# Patient Record
Sex: Female | Born: 1950
Health system: Southern US, Community
[De-identification: ages and names within clinical notes are randomized; demographics above are authoritative.]

## PROBLEM LIST (undated history)

## (undated) DIAGNOSIS — I10 Essential (primary) hypertension: Secondary | ICD-10-CM

## (undated) DIAGNOSIS — C4491 Basal cell carcinoma of skin, unspecified: Secondary | ICD-10-CM

## (undated) HISTORY — DX: Basal cell carcinoma of skin, unspecified: C44.91

## (undated) HISTORY — DX: Essential (primary) hypertension: I10

## (undated) HISTORY — PX: CARPAL TUNNEL RELEASE: SHX101

## (undated) HISTORY — PX: BASAL CELL CARCINOMA EXCISION: SHX1214

---

## 2002-07-06 ENCOUNTER — Encounter: Payer: Self-pay | Admitting: Family Medicine

## 2002-07-06 ENCOUNTER — Ambulatory Visit (HOSPITAL_COMMUNITY): Admission: RE | Admit: 2002-07-06 | Discharge: 2002-07-06 | Payer: Self-pay | Admitting: Family Medicine

## 2004-12-07 ENCOUNTER — Ambulatory Visit (HOSPITAL_COMMUNITY): Admission: RE | Admit: 2004-12-07 | Discharge: 2004-12-07 | Payer: Self-pay | Admitting: Family Medicine

## 2005-09-06 DIAGNOSIS — D229 Melanocytic nevi, unspecified: Secondary | ICD-10-CM

## 2005-09-06 HISTORY — DX: Melanocytic nevi, unspecified: D22.9

## 2008-12-08 DIAGNOSIS — C4491 Basal cell carcinoma of skin, unspecified: Secondary | ICD-10-CM

## 2008-12-08 HISTORY — DX: Basal cell carcinoma of skin, unspecified: C44.91

## 2010-11-19 ENCOUNTER — Encounter: Payer: Self-pay | Admitting: Family Medicine

## 2012-06-13 DIAGNOSIS — C4491 Basal cell carcinoma of skin, unspecified: Secondary | ICD-10-CM

## 2012-06-13 HISTORY — DX: Basal cell carcinoma of skin, unspecified: C44.91

## 2013-01-30 ENCOUNTER — Other Ambulatory Visit: Payer: Self-pay | Admitting: Nurse Practitioner

## 2013-03-05 ENCOUNTER — Encounter: Payer: Self-pay | Admitting: *Deleted

## 2013-03-06 ENCOUNTER — Ambulatory Visit (INDEPENDENT_AMBULATORY_CARE_PROVIDER_SITE_OTHER): Payer: 59 | Admitting: Nurse Practitioner

## 2013-03-06 ENCOUNTER — Encounter: Payer: Self-pay | Admitting: Nurse Practitioner

## 2013-03-06 VITALS — BP 132/84 | HR 70 | Ht 66.0 in | Wt 203.5 lb

## 2013-03-06 DIAGNOSIS — R5381 Other malaise: Secondary | ICD-10-CM

## 2013-03-06 DIAGNOSIS — I10 Essential (primary) hypertension: Secondary | ICD-10-CM | POA: Insufficient documentation

## 2013-03-06 DIAGNOSIS — R5383 Other fatigue: Secondary | ICD-10-CM

## 2013-03-06 MED ORDER — LOSARTAN POTASSIUM-HCTZ 100-25 MG PO TABS: 1.0000 | ORAL_TABLET | Freq: Every day | ORAL | Status: DC

## 2013-03-10 ENCOUNTER — Encounter: Payer: Self-pay | Admitting: Nurse Practitioner

## 2013-03-10 NOTE — Assessment & Plan Note (Signed)
Stop lisinopril due to cough. Switched to losartan/HCTZ. Recheck in 6 months.

## 2013-03-10 NOTE — Progress Notes (Signed)
Subjective:  Presents for routine followup. Has developed a half he cough over the past 2-3 months. Nonproductive. Occurs at any time. Keeps cough drops in her mouth to keep the cough under control. No chest pain. No wheezing. Minimal head congestion and runny nose. Nonsmoker. No shortness of breath. Currently on lisinopril/HCTZ for her blood pressure. Some generalized fatigue. No orthopnea. No hemoptysis.   Objective:   BP 132/84  Pulse 70  Ht 5\' 6"  (1.676 m)  Wt 203 lb 8 oz (92.307 kg)  BMI 32.86 kg/m2 NAD. Alert, oriented. Lungs clear. Heart regular rate rhythm. Lower extremities no edema.  Assessment: Hypertension - Plan: Basic metabolic panel, Lipid panel, Basic metabolic panel, Lipid panel  Fatigue - Plan: Basic metabolic panel, Hepatic function panel, TSH, Basic metabolic panel, Hepatic function panel, TSH   Plan: DC lisinopril. Most likely the cause of her persistent cough. Switched to losartan. Meds ordered this encounter  Medications  . losartan-hydrochlorothiazide (HYZAAR) 100-25 MG per tablet    Sig: Take 1 tablet by mouth daily.    Dispense:  30 tablet    Refill:  5    Order Specific Question:  Supervising Provider    Answer:  Merlyn Albert [[id number]]   Call back in 2-3 weeks if cough persist. Reminded about preventive health physical, this may be limited due to insurance coverage. Otherwise recheck in 6 months.

## 2013-07-10 DIAGNOSIS — C4492 Squamous cell carcinoma of skin, unspecified: Secondary | ICD-10-CM

## 2013-07-10 HISTORY — DX: Squamous cell carcinoma of skin, unspecified: C44.92

## 2013-08-28 ENCOUNTER — Other Ambulatory Visit: Payer: Self-pay | Admitting: Nurse Practitioner

## 2013-10-01 ENCOUNTER — Encounter: Payer: Self-pay | Admitting: Nurse Practitioner

## 2013-10-01 ENCOUNTER — Ambulatory Visit (INDEPENDENT_AMBULATORY_CARE_PROVIDER_SITE_OTHER): Payer: 59 | Admitting: Nurse Practitioner

## 2013-10-01 VITALS — BP 182/114 | Ht 66.0 in | Wt 209.0 lb

## 2013-10-01 DIAGNOSIS — I10 Essential (primary) hypertension: Secondary | ICD-10-CM

## 2013-10-01 DIAGNOSIS — R5381 Other malaise: Secondary | ICD-10-CM

## 2013-10-01 DIAGNOSIS — Z79899 Other long term (current) drug therapy: Secondary | ICD-10-CM

## 2013-10-01 MED ORDER — LOSARTAN POTASSIUM-HCTZ 100-25 MG PO TABS: ORAL_TABLET | ORAL | Status: DC

## 2013-10-05 ENCOUNTER — Encounter: Payer: Self-pay | Admitting: Nurse Practitioner

## 2013-10-05 NOTE — Assessment & Plan Note (Signed)
.   losartan-hydrochlorothiazide (HYZAAR) 100-25 MG per tablet    Sig: TAKE ONE TABLET BY MOUTH ONCE DAILY.    Dispense:  90 tablet    Refill:  1    Order Specific Question:  Supervising Provider    Answer:  Riccardo Dubin    The patient did not get lab work as recommended in May. Given new orders. Reviewed flu vaccine. Defers preventive health maintenance. Discussed importance of compliance with medication. Recheck in 6 months, call back sooner if any problems.

## 2013-10-05 NOTE — Progress Notes (Signed)
Subjective:  Presents for routine followup on her blood pressure. Has been out of her blood pressure medication for the past 2 days. No chest pain shortness of breath or edema. Does fairly well with her diet. Continues to refuse preventive health maintenance such as physical and immunizations.  Objective:   BP 182/114  Ht 5\' 6"  (1.676 m)  Wt 209 lb (94.802 kg)  BMI 33.75 kg/m2 NAD. Alert, oriented. Lungs clear. Heart regular rate rhythm. No murmur or gallop noted. Lower extremities no edema.  Assessment:Unspecified essential hypertension - Plan: Lipid panel  Encounter for long-term (current) use of other medications - Plan: Hepatic function panel, Basic metabolic panel  Other malaise and fatigue - Plan: TSH  Plan:  Meds ordered this encounter  Medications  . losartan-hydrochlorothiazide (HYZAAR) 100-25 MG per tablet    Sig: TAKE ONE TABLET BY MOUTH ONCE DAILY.    Dispense:  90 tablet    Refill:  1    Order Specific Question:  Supervising Provider    Answer:  Riccardo Dubin    The patient did not get lab work as recommended in May. Given new orders. Reviewed flu vaccine. Defers preventive health maintenance. Discussed importance of compliance with medication. Recheck in 6 months, call back sooner if any problems.

## 2014-03-18 ENCOUNTER — Other Ambulatory Visit: Payer: Self-pay | Admitting: Nurse Practitioner

## 2014-06-02 DIAGNOSIS — L570 Actinic keratosis: Secondary | ICD-10-CM | POA: Diagnosis not present

## 2014-06-02 DIAGNOSIS — L739 Follicular disorder, unspecified: Secondary | ICD-10-CM | POA: Diagnosis not present

## 2014-06-02 DIAGNOSIS — L821 Other seborrheic keratosis: Secondary | ICD-10-CM | POA: Diagnosis not present

## 2014-06-02 DIAGNOSIS — D485 Neoplasm of uncertain behavior of skin: Secondary | ICD-10-CM | POA: Diagnosis not present

## 2014-06-02 DIAGNOSIS — D2330 Other benign neoplasm of skin of unspecified part of face: Secondary | ICD-10-CM | POA: Diagnosis not present

## 2014-06-07 ENCOUNTER — Encounter: Payer: Self-pay | Admitting: Family Medicine

## 2014-06-07 ENCOUNTER — Ambulatory Visit (INDEPENDENT_AMBULATORY_CARE_PROVIDER_SITE_OTHER): Payer: Medicare Other | Admitting: Family Medicine

## 2014-06-07 VITALS — BP 136/86 | Ht 66.0 in | Wt 208.2 lb

## 2014-06-07 DIAGNOSIS — R5381 Other malaise: Secondary | ICD-10-CM | POA: Diagnosis not present

## 2014-06-07 DIAGNOSIS — R739 Hyperglycemia, unspecified: Secondary | ICD-10-CM | POA: Insufficient documentation

## 2014-06-07 DIAGNOSIS — R5383 Other fatigue: Secondary | ICD-10-CM

## 2014-06-07 DIAGNOSIS — I10 Essential (primary) hypertension: Secondary | ICD-10-CM | POA: Diagnosis not present

## 2014-06-07 DIAGNOSIS — E785 Hyperlipidemia, unspecified: Secondary | ICD-10-CM | POA: Insufficient documentation

## 2014-06-07 LAB — CBC WITH DIFFERENTIAL/PLATELET
Basophils Absolute: 0 10*3/uL (ref 0.0–0.1)
Basophils Relative: 1 % (ref 0–1)
EOS ABS: 0.1 10*3/uL (ref 0.0–0.7)
EOS PCT: 3 % (ref 0–5)
HEMATOCRIT: 40.1 % (ref 36.0–46.0)
HEMOGLOBIN: 13.7 g/dL (ref 12.0–15.0)
LYMPHS ABS: 1.4 10*3/uL (ref 0.7–4.0)
Lymphocytes Relative: 36 % (ref 12–46)
MCH: 30.2 pg (ref 26.0–34.0)
MCHC: 34.2 g/dL (ref 30.0–36.0)
MCV: 88.5 fL (ref 78.0–100.0)
MONO ABS: 0.3 10*3/uL (ref 0.1–1.0)
Monocytes Relative: 8 % (ref 3–12)
Neutro Abs: 2 10*3/uL (ref 1.7–7.7)
Neutrophils Relative %: 52 % (ref 43–77)
Platelets: 179 10*3/uL (ref 150–400)
RBC: 4.53 MIL/uL (ref 3.87–5.11)
RDW: 13 % (ref 11.5–15.5)
WBC: 3.9 10*3/uL — ABNORMAL LOW (ref 4.0–10.5)

## 2014-06-07 LAB — BASIC METABOLIC PANEL
BUN: 16 mg/dL (ref 6–23)
CALCIUM: 9.6 mg/dL (ref 8.4–10.5)
CO2: 30 mEq/L (ref 19–32)
Chloride: 102 mEq/L (ref 96–112)
Creat: 1.01 mg/dL (ref 0.50–1.10)
Glucose, Bld: 103 mg/dL — ABNORMAL HIGH (ref 70–99)
POTASSIUM: 3.8 meq/L (ref 3.5–5.3)
Sodium: 139 mEq/L (ref 135–145)

## 2014-06-07 LAB — LIPID PANEL
CHOL/HDL RATIO: 4.6 ratio
Cholesterol: 196 mg/dL (ref 0–200)
HDL: 43 mg/dL (ref >39)
LDL Cholesterol: 120 mg/dL — ABNORMAL HIGH (ref 0–99)
Triglycerides: 165 mg/dL — ABNORMAL HIGH (ref <150)
VLDL: 33 mg/dL (ref 0–40)

## 2014-06-07 MED ORDER — LOSARTAN POTASSIUM-HCTZ 100-25 MG PO TABS: ORAL_TABLET | ORAL | Status: DC

## 2014-06-07 NOTE — Patient Instructions (Signed)
DASH Eating Plan °DASH stands for "Dietary Approaches to Stop Hypertension." The DASH eating plan is a healthy eating plan that has been shown to reduce high blood pressure (hypertension). Additional health benefits may include reducing the risk of type 2 diabetes mellitus, heart disease, and stroke. The DASH eating plan may also help with weight loss. °WHAT DO I NEED TO KNOW ABOUT THE DASH EATING PLAN? °For the DASH eating plan, you will follow these general guidelines: °· Choose foods with a percent daily value for sodium of less than 5% (as listed on the food label). °· Use salt-free seasonings or herbs instead of table salt or sea salt. °· Check with your health care provider or pharmacist before using salt substitutes. °· Eat lower-sodium products, often labeled as "lower sodium" or "no salt added." °· Eat fresh foods. °· Eat more vegetables, fruits, and low-fat dairy products. °· Choose whole grains. Look for the word "whole" as the first word in the ingredient list. °· Choose fish and skinless chicken or turkey more often than red meat. Limit fish, poultry, and meat to 6 oz (170 g) each day. °· Limit sweets, desserts, sugars, and sugary drinks. °· Choose heart-healthy fats. °· Limit cheese to 1 oz (28 g) per day. °· Eat more home-cooked food and less restaurant, buffet, and fast food. °· Limit fried foods. °· Cook foods using methods other than frying. °· Limit canned vegetables. If you do use them, rinse them well to decrease the sodium. °· When eating at a restaurant, ask that your food be prepared with less salt, or no salt if possible. °WHAT FOODS CAN I EAT? °Seek help from a dietitian for individual calorie needs. °Grains °Whole grain or whole wheat bread. Brown rice. Whole grain or whole wheat pasta. Quinoa, bulgur, and whole grain cereals. Low-sodium cereals. Corn or whole wheat flour tortillas. Whole grain cornbread. Whole grain crackers. Low-sodium crackers. °Vegetables °Fresh or frozen vegetables  (raw, steamed, roasted, or grilled). Low-sodium or reduced-sodium tomato and vegetable juices. Low-sodium or reduced-sodium tomato sauce and paste. Low-sodium or reduced-sodium canned vegetables.  °Fruits °All fresh, canned (in natural juice), or frozen fruits. °Meat and Other Protein Products °Ground beef (85% or leaner), grass-fed beef, or beef trimmed of fat. Skinless chicken or turkey. Ground chicken or turkey. Pork trimmed of fat. All fish and seafood. Eggs. Dried beans, peas, or lentils. Unsalted nuts and seeds. Unsalted canned beans. °Dairy °Low-fat dairy products, such as skim or 1% milk, 2% or reduced-fat cheeses, low-fat ricotta or cottage cheese, or plain low-fat yogurt. Low-sodium or reduced-sodium cheeses. °Fats and Oils °Tub margarines without trans fats. Light or reduced-fat mayonnaise and salad dressings (reduced sodium). Avocado. Safflower, olive, or canola oils. Natural peanut or almond butter. °Other °Unsalted popcorn and pretzels. °The items listed above may not be a complete list of recommended foods or beverages. Contact your dietitian for more options. °WHAT FOODS ARE NOT RECOMMENDED? °Grains °White bread. White pasta. White rice. Refined cornbread. Bagels and croissants. Crackers that contain trans fat. °Vegetables °Creamed or fried vegetables. Vegetables in a cheese sauce. Regular canned vegetables. Regular canned tomato sauce and paste. Regular tomato and vegetable juices. °Fruits °Dried fruits. Canned fruit in light or heavy syrup. Fruit juice. °Meat and Other Protein Products °Fatty cuts of meat. Ribs, chicken wings, bacon, sausage, bologna, salami, chitterlings, fatback, hot dogs, bratwurst, and packaged luncheon meats. Salted nuts and seeds. Canned beans with salt. °Dairy °Whole or 2% milk, cream, half-and-half, and cream cheese. Whole-fat or sweetened yogurt. Full-fat   cheeses or blue cheese. Nondairy creamers and whipped toppings. Processed cheese, cheese spreads, or cheese  curds. °Condiments °Onion and garlic salt, seasoned salt, table salt, and sea salt. Canned and packaged gravies. Worcestershire sauce. Tartar sauce. Barbecue sauce. Teriyaki sauce. Soy sauce, including reduced sodium. Steak sauce. Fish sauce. Oyster sauce. Cocktail sauce. Horseradish. Ketchup and mustard. Meat flavorings and tenderizers. Bouillon cubes. Hot sauce. Tabasco sauce. Marinades. Taco seasonings. Relishes. °Fats and Oils °Butter, stick margarine, lard, shortening, ghee, and bacon fat. Coconut, palm kernel, or palm oils. Regular salad dressings. °Other °Pickles and olives. Salted popcorn and pretzels. °The items listed above may not be a complete list of foods and beverages to avoid. Contact your dietitian for more information. °WHERE CAN I FIND MORE INFORMATION? °National Heart, Lung, and [employer] Institute: www.nhlbi.nih.gov/health/health-topics/topics/dash/ °Document Released: 10/04/2011 Document Revised: 03/01/2014 Document Reviewed: 08/19/2013 °ExitCare® Patient Information ©2015 [employer]. This information is not intended to replace advice given to you by your health care provider. Make sure you discuss any questions you have with your health care provider. ° °

## 2014-06-07 NOTE — Progress Notes (Signed)
   Subjective:    Patient ID: Charlene Thompson, female    DOB: June 17, 1951, 63 y.o.   MRN: [id number]  Hypertension This is a chronic problem. The current episode started more than 1 year ago. Pertinent negatives include no chest pain or shortness of breath. Risk factors for coronary artery disease include post-menopausal state. Treatments tried: hyzaar. There are no compliance problems.    I had a long discussion with the patient regarding the importance of healthy choices minimizing salt regular physical activity.   Review of Systems  Constitutional: Negative for activity change, appetite change and fatigue.  Respiratory: Negative for shortness of breath.   Cardiovascular: Negative for chest pain.  Gastrointestinal: Negative for abdominal pain.  Endocrine: Negative for polydipsia and polyphagia.  Genitourinary: Negative for frequency.  Neurological: Negative for weakness.  Psychiatric/Behavioral: Negative for confusion.       Objective:   Physical Exam  Vitals reviewed. Constitutional: She appears well-nourished. No distress.  Cardiovascular: Normal rate, regular rhythm and normal heart sounds.   No murmur heard. Pulmonary/Chest: Effort normal and breath sounds normal. No respiratory distress.  Musculoskeletal: She exhibits no edema.  Lymphadenopathy:    She has no cervical adenopathy.  Neurological: She is alert. She exhibits normal muscle tone.  Psychiatric: Her behavior is normal.          Assessment & Plan:  #1 HTN she is to try to work on salt intake she is to try to exercise a little bit more. Continue with her medication. Followup 6 months.  #2 I did recommend some screening lab work. Await the results of these  #3 I did recommend a wellness exam I recommend mammogram recommended colonoscopy not sure patient will follow through to do these.

## 2014-07-01 ENCOUNTER — Telehealth: Payer: Self-pay

## 2014-07-01 NOTE — Telephone Encounter (Signed)
Dr Wolfgang Phoenix suggested patient call to set up her colonoscopy. Please call her back at [phone]

## 2014-07-02 ENCOUNTER — Encounter: Payer: Self-pay | Admitting: Gastroenterology

## 2014-07-06 DIAGNOSIS — Z1231 Encounter for screening mammogram for malignant neoplasm of breast: Secondary | ICD-10-CM | POA: Diagnosis not present

## 2014-07-06 NOTE — Telephone Encounter (Signed)
PATIENT RETURNED CALL 07/02/14  PLEASE CALL BACK [phone]

## 2014-07-06 NOTE — Telephone Encounter (Signed)
LMOM for pt to call back after 3:30 this afternoon to be triaged for colonoscopy.

## 2014-07-06 NOTE — Telephone Encounter (Signed)
Pt said she has called another GI doctor for consultation and she will see how that goes and let us know if she needs [place].

## 2014-07-12 NOTE — Telephone Encounter (Signed)
Noted  

## 2014-08-17 ENCOUNTER — Encounter: Payer: Medicare Other | Admitting: Nurse Practitioner

## 2014-09-03 ENCOUNTER — Encounter: Payer: Self-pay | Admitting: Gastroenterology

## 2014-09-03 ENCOUNTER — Ambulatory Visit (INDEPENDENT_AMBULATORY_CARE_PROVIDER_SITE_OTHER): Payer: Medicare Other | Admitting: Gastroenterology

## 2014-09-03 VITALS — BP 126/90 | HR 68 | Ht 66.0 in | Wt 207.5 lb

## 2014-09-03 DIAGNOSIS — I10 Essential (primary) hypertension: Secondary | ICD-10-CM

## 2014-09-03 DIAGNOSIS — Z800 Family history of malignant neoplasm of digestive organs: Secondary | ICD-10-CM

## 2014-09-03 MED ORDER — NA SULFATE-K SULFATE-MG SULF 17.5-3.13-1.6 GM/177ML PO SOLN: 1.0000 | Freq: Once | ORAL | Status: DC

## 2014-09-03 NOTE — Progress Notes (Signed)
    _                                                                                                                History of Present Illness:  Ms. Wisham pleasant 63 year old white female referred for screening colonoscopy.  The only history is pertinent for sister who apparently had colon cancer.  Mrs. Hamidi has no GI complaints including change of bowel habits, abdominal pain, melena or hematochezia   Past Medical History  Diagnosis Date  . Hypertension   . Basal cell carcinoma    Past Surgical History  Procedure Laterality Date  . Basal cell carcinoma excision      nose  . Carpal tunnel release Right    family history includes Diabetes in her brother, mother, and sister; Heart disease in her brother and mother; Kidney disease in her sister. Current Outpatient Prescriptions  Medication Sig Dispense Refill  . losartan-hydrochlorothiazide (HYZAAR) 100-25 MG per tablet TAKE ONE TABLET BY MOUTH ONCE DAILY. 90 tablet 2   No current facility-administered medications for this visit.   Allergies as of 09/03/2014 - Review Complete 09/03/2014  Allergen Reaction Noted  . Valium [diazepam]  03/05/2013    reports that she has never smoked. She has never used smokeless tobacco. She reports that she drinks alcohol. She reports that she does not use illicit drugs.   Review of Systems: Pertinent positive and negative review of systems were noted in the above HPI section. All other review of systems were otherwise negative.  Vital signs were reviewed in today's medical record Physical Exam: General: Well developed , well nourished, no acute distress Skin: anicteric Head: Normocephalic and atraumatic Eyes:  sclerae anicteric, EOMI Ears: Normal auditory acuity Mouth: No deformity or lesions Neck: Supple, no masses or thyromegaly Lungs: Clear throughout to auscultation Heart: Regular rate and rhythm; no murmurs, rubs or bruits Abdomen: Soft, non tender and non distended.  No masses, hepatosplenomegaly or hernias noted. Normal Bowel sounds Rectal:deferred Musculoskeletal: Symmetrical with no gross deformities  Skin: No lesions on visible extremities Pulses:  Normal pulses noted Extremities: No clubbing, cyanosis, edema or deformities noted Neurological: Alert oriented x 4, grossly nonfocal Cervical Nodes:  No significant cervical adenopathy Inguinal Nodes: No significant inguinal adenopathy Psychological:  Alert and cooperative. Normal mood and affect  See Assessment and Plan under Problem List

## 2014-09-03 NOTE — Patient Instructions (Signed)

## 2014-09-03 NOTE — Assessment & Plan Note (Signed)
Plan screening COLONOSCOPY every 5 years

## 2014-11-22 ENCOUNTER — Encounter: Payer: Self-pay | Admitting: Gastroenterology

## 2014-11-22 ENCOUNTER — Ambulatory Visit (AMBULATORY_SURGERY_CENTER): Payer: Medicare Other | Admitting: Gastroenterology

## 2014-11-22 VITALS — BP 119/69 | HR 51 | Temp 97.5°F | Resp 28 | Ht 66.0 in | Wt 207.0 lb

## 2014-11-22 DIAGNOSIS — I10 Essential (primary) hypertension: Secondary | ICD-10-CM | POA: Diagnosis not present

## 2014-11-22 DIAGNOSIS — Z800 Family history of malignant neoplasm of digestive organs: Secondary | ICD-10-CM

## 2014-11-22 DIAGNOSIS — D122 Benign neoplasm of ascending colon: Secondary | ICD-10-CM | POA: Diagnosis not present

## 2014-11-22 DIAGNOSIS — Z1211 Encounter for screening for malignant neoplasm of colon: Secondary | ICD-10-CM

## 2014-11-22 MED ORDER — SODIUM CHLORIDE 0.9 % IV SOLN: 500.0000 mL | INTRAVENOUS | Status: DC

## 2014-11-22 NOTE — Op Note (Signed)
[place]  [address] [place], [id number]   COLONOSCOPY PROCEDURE REPORT  PATIENT: Charlene Thompson, Charlene Thompson  MR#: [id number] BIRTHDATE: 1951-01-27 , 63  yrs. old GENDER: female ENDOSCOPIST: Inda Castle, MD REFERRED LM:BEMLJ Wolfgang Phoenix, M.D. PROCEDURE DATE:  11/22/2014 PROCEDURE:   Colonoscopy with snare polypectomy First Screening Colonoscopy - Avg.  risk and is 50 yrs.  old or older Yes.  Prior Negative Screening - Now for repeat screening. N/A  History of Adenoma - Now for follow-up colonoscopy & has been > or = to 3 yrs.  N/A  Polyps Removed Today? Yes. ASA CLASS:   Class II INDICATIONS:patient's immediate family history of colon cancer. MEDICATIONS: Monitored anesthesia care and Propofol 170 mg IV  DESCRIPTION OF PROCEDURE:   After the risks benefits and alternatives of the procedure were thoroughly explained, informed consent was obtained.  The digital rectal exam revealed no abnormalities of the rectum.   The LB [id number]  endoscope was introduced through the anus and advanced to the cecum, which was identified by both the appendix and ileocecal valve. No adverse events experienced.   The quality of the prep was excellent using Suprep  The instrument was then slowly withdrawn as the colon was fully examined.      COLON FINDINGS: A sessile polyp measuring 4 mm in size was found in the ascending colon.  A polypectomy was performed with a cold snare.  The resection was complete, the polyp tissue was completely retrieved and sent to histology.   There was mild diverticulosis noted in the descending colon and sigmoid colon.   Internal hemorrhoids were found.  Retroflexed views revealed no abnormalities. The time to cecum=2 minutes 40 seconds.  Withdrawal time=7 minutes 02 seconds.  The scope was withdrawn and the procedure completed. COMPLICATIONS: There were no immediate complications.  ENDOSCOPIC IMPRESSION: 1.   Sessile polyp was found in the  ascending colon; polypectomy was performed with a cold snare 2.   There was mild diverticulosis noted in the descending colon and sigmoid colon 3.   Internal hemorrhoids  RECOMMENDATIONS: Given your significant family history of colon cancer, you should have a repeat colonoscopy in 5 years  eSigned:  Inda Castle, MD 11/22/2014 8:36 AM   cc:   PATIENT NAME:  Charlene Thompson, Charlene Thompson MR#: [id number]

## 2014-11-22 NOTE — Patient Instructions (Addendum)
Handouts given on polyps, diverticulosis, high fiber diet, and hemorrhoids. Repeat colonoscopy in 5 years.   YOU HAD AN ENDOSCOPIC PROCEDURE TODAY AT [facility]: Refer to the procedure report that was given to you for any specific questions about what was found during the examination.  If the procedure report does not answer your questions, please call your gastroenterologist to clarify.  If you requested that your care partner not be given the details of your procedure findings, then the procedure report has been included in a sealed envelope for you to review at your convenience later.  YOU SHOULD EXPECT: Some feelings of bloating in the abdomen. Passage of more gas than usual.  Walking can help get rid of the air that was put into your GI tract during the procedure and reduce the bloating. If you had a lower endoscopy (such as a colonoscopy or flexible sigmoidoscopy) you may notice spotting of blood in your stool or on the toilet paper. If you underwent a bowel prep for your procedure, then you may not have a normal bowel movement for a few days.  DIET: Your first meal following the procedure should be a light meal and then it is ok to progress to your normal diet.  A half-sandwich or bowl of soup is an example of a good first meal.  Heavy or fried foods are harder to digest and may make you feel nauseous or bloated.  Likewise meals heavy in dairy and vegetables can cause extra gas to form and this can also increase the bloating.  Drink plenty of fluids but you should avoid alcoholic beverages for 24 hours.  ACTIVITY: Your care partner should take you home directly after the procedure.  You should plan to take it easy, moving slowly for the rest of the day.  You can resume normal activity the day after the procedure however you should NOT DRIVE or use heavy machinery for 24 hours (because of the sedation medicines used during the test).    SYMPTOMS TO REPORT IMMEDIATELY: A  gastroenterologist can be reached at any hour.  During normal business hours, 8:30 AM to 5:00 PM Monday through Friday, call [phone].  After hours and on weekends, please call the GI answering service at [phone] who will take a message and have the physician on call contact you.   Following lower endoscopy (colonoscopy or flexible sigmoidoscopy):  Excessive amounts of blood in the stool  Significant tenderness or worsening of abdominal pains  Swelling of the abdomen that is new, acute  Fever of 100F or higher  FOLLOW UP: If any biopsies were taken you will be contacted by phone or by letter within the next 1-3 weeks.  Call your gastroenterologist if you have not heard about the biopsies in 3 weeks.  Our staff will call the home number listed on your records the next business day following your procedure to check on you and address any questions or concerns that you may have at that time regarding the information given to you following your procedure. This is a courtesy call and so if there is no answer at the home number and we have not heard from you through the emergency physician on call, we will assume that you have returned to your regular daily activities without incident.  SIGNATURES/CONFIDENTIALITY: You and/or your care partner have signed paperwork which will be entered into your electronic medical record.  These signatures attest to the fact that that the information above on your  After Visit Summary has been reviewed and is understood.  Full responsibility of the confidentiality of this discharge information lies with you and/or your care-partner.

## 2014-11-22 NOTE — Progress Notes (Signed)
Procedure ends, to recovery, report given and VSS. 

## 2014-11-22 NOTE — Progress Notes (Signed)
Called to room to assist during endoscopic procedure.  Patient ID and intended procedure confirmed with present staff. Received instructions for my participation in the procedure from the performing physician.  

## 2014-11-23 ENCOUNTER — Telehealth: Payer: Self-pay

## 2014-11-23 NOTE — Telephone Encounter (Signed)
  Follow up Call-  Call back number 11/22/2014  Post procedure Call Back phone  # [phone]  Permission to leave phone message Yes     Patient questions:  Do you have a fever, pain , or abdominal swelling? No. Pain Score  0 *  Have you tolerated food without any problems? Yes.    Have you been able to return to your normal activities? Yes.    Do you have any questions about your discharge instructions: Diet   No. Medications  No. Follow up visit  No.  Do you have questions or concerns about your Care? No.  Actions: * If pain score is 4 or above: No action needed, pain <4.

## 2014-12-01 ENCOUNTER — Encounter: Payer: Self-pay | Admitting: Gastroenterology

## 2015-02-22 ENCOUNTER — Other Ambulatory Visit: Payer: Self-pay | Admitting: Physician Assistant

## 2015-02-22 DIAGNOSIS — L719 Rosacea, unspecified: Secondary | ICD-10-CM | POA: Diagnosis not present

## 2015-02-22 DIAGNOSIS — L821 Other seborrheic keratosis: Secondary | ICD-10-CM | POA: Diagnosis not present

## 2015-02-22 DIAGNOSIS — D485 Neoplasm of uncertain behavior of skin: Secondary | ICD-10-CM | POA: Diagnosis not present

## 2015-03-04 ENCOUNTER — Ambulatory Visit (INDEPENDENT_AMBULATORY_CARE_PROVIDER_SITE_OTHER): Payer: Medicare Other | Admitting: Nurse Practitioner

## 2015-03-04 ENCOUNTER — Encounter: Payer: Self-pay | Admitting: Nurse Practitioner

## 2015-03-04 VITALS — BP 132/84 | Ht 66.25 in | Wt 207.2 lb

## 2015-03-04 DIAGNOSIS — Z0000 Encounter for general adult medical examination without abnormal findings: Secondary | ICD-10-CM

## 2015-03-04 DIAGNOSIS — Z13820 Encounter for screening for osteoporosis: Secondary | ICD-10-CM

## 2015-03-04 DIAGNOSIS — R739 Hyperglycemia, unspecified: Secondary | ICD-10-CM

## 2015-03-04 DIAGNOSIS — R7303 Prediabetes: Secondary | ICD-10-CM

## 2015-03-04 DIAGNOSIS — Z780 Asymptomatic menopausal state: Secondary | ICD-10-CM

## 2015-03-04 DIAGNOSIS — R7309 Other abnormal glucose: Secondary | ICD-10-CM

## 2015-03-04 DIAGNOSIS — Z23 Encounter for immunization: Secondary | ICD-10-CM

## 2015-03-04 DIAGNOSIS — Z01419 Encounter for gynecological examination (general) (routine) without abnormal findings: Secondary | ICD-10-CM

## 2015-03-04 LAB — POCT GLYCOSYLATED HEMOGLOBIN (HGB A1C): Hemoglobin A1C: 5.8

## 2015-03-04 MED ORDER — LOSARTAN POTASSIUM-HCTZ 100-25 MG PO TABS: ORAL_TABLET | ORAL | Status: DC

## 2015-03-06 ENCOUNTER — Encounter: Payer: Self-pay | Admitting: Nurse Practitioner

## 2015-03-06 DIAGNOSIS — R7303 Prediabetes: Secondary | ICD-10-CM | POA: Insufficient documentation

## 2015-03-06 NOTE — Progress Notes (Signed)
   Subjective:    Patient ID: Charlene Thompson, female    DOB: 1951/06/02, 64 y.o.   MRN: [id number]  HPI presents for her wellness exam. Married, same sexual partner. No vaginal bleeding. No pelvic pain. Needs vision and dental exams. Had her colonoscopy in January. History of normal PAP smears. Gets skin cancer screenings. Very active lifestyle. Overall healthy diet.     Review of Systems  Constitutional: Negative for activity change, appetite change and fatigue.  HENT: Negative for dental problem, ear pain, sinus pressure and sore throat.   Respiratory: Negative for cough, chest tightness, shortness of breath and wheezing.   Cardiovascular: Negative for chest pain.  Gastrointestinal: Negative for nausea, vomiting, abdominal pain, diarrhea, constipation and abdominal distention.  Genitourinary: Negative for dysuria, urgency, frequency, vaginal discharge, enuresis, difficulty urinating, genital sores and pelvic pain.       Objective:   Physical Exam  Constitutional: She is oriented to person, place, and time. She appears well-developed. No distress.  HENT:  Right Ear: External ear normal.  Left Ear: External ear normal.  Mouth/Throat: Oropharynx is clear and moist.  Neck: Normal range of motion. Neck supple. No tracheal deviation present. No thyromegaly present.  Cardiovascular: Normal rate, regular rhythm and normal heart sounds.  Exam reveals no gallop.   No murmur heard. Pulmonary/Chest: Effort normal and breath sounds normal.  Abdominal: Soft. She exhibits no distension. There is no tenderness.  Genitourinary: Vagina normal and uterus normal. No vaginal discharge found.  External GU: no rashes or lesions. Vagina: no discharge. Cervix nl in appearance.  Bimanual exam: no CMT; no tenderness or obvious masses.    Musculoskeletal: She exhibits no edema.  Lymphadenopathy:    She has no cervical adenopathy.  Neurological: She is alert and oriented to person, place, and time.  Skin:  Skin is warm and dry. No rash noted.  Psychiatric: She has a normal mood and affect. Her behavior is normal.  Vitals reviewed. Breast exam: no masses; axillae no adenopathy. Results for orders placed or performed in visit on 03/04/15  POCT glycosylated hemoglobin (Hb A1C)  Result Value Ref Range   Hemoglobin A1C 5.8           Assessment & Plan:  Well woman exam  Hyperglycemia - Plan: POCT glycosylated hemoglobin (Hb A1C)  Need for vaccination - Plan: Pneumococcal polysaccharide vaccine 23-valent greater than or equal to 2yo subcutaneous/IM  Screening for osteoporosis  Post-menopausal - Plan: DG Bone Density  Recommend regular exercise, healthy diet and weight loss. Daily vitamin D/calcium. Return in about 6 months (around 09/04/2015) for recheck.

## 2015-03-09 ENCOUNTER — Ambulatory Visit (HOSPITAL_COMMUNITY)
Admission: RE | Admit: 2015-03-09 | Discharge: 2015-03-09 | Disposition: A | Discharge status: Home / Self Care | Payer: Medicare Other | Source: Ambulatory Visit | Attending: Nurse Practitioner | Admitting: Nurse Practitioner

## 2015-03-09 DIAGNOSIS — M8580 Other specified disorders of bone density and structure, unspecified site: Secondary | ICD-10-CM | POA: Insufficient documentation

## 2015-03-09 DIAGNOSIS — M85852 Other specified disorders of bone density and structure, left thigh: Secondary | ICD-10-CM | POA: Diagnosis not present

## 2015-03-09 DIAGNOSIS — Z780 Asymptomatic menopausal state: Secondary | ICD-10-CM | POA: Insufficient documentation

## 2015-03-10 ENCOUNTER — Encounter: Payer: Self-pay | Admitting: Nurse Practitioner

## 2015-03-10 DIAGNOSIS — M8580 Other specified disorders of bone density and structure, unspecified site: Secondary | ICD-10-CM | POA: Insufficient documentation

## 2015-06-07 ENCOUNTER — Other Ambulatory Visit: Payer: Self-pay | Admitting: Nurse Practitioner

## 2015-08-29 ENCOUNTER — Ambulatory Visit (INDEPENDENT_AMBULATORY_CARE_PROVIDER_SITE_OTHER): Payer: Medicare Other | Admitting: Nurse Practitioner

## 2015-08-29 ENCOUNTER — Encounter: Payer: Self-pay | Admitting: Nurse Practitioner

## 2015-08-29 VITALS — BP 136/82 | Ht 66.0 in | Wt 206.2 lb

## 2015-08-29 DIAGNOSIS — R7303 Prediabetes: Secondary | ICD-10-CM | POA: Diagnosis not present

## 2015-08-29 DIAGNOSIS — I10 Essential (primary) hypertension: Secondary | ICD-10-CM

## 2015-08-29 DIAGNOSIS — E785 Hyperlipidemia, unspecified: Secondary | ICD-10-CM | POA: Diagnosis not present

## 2015-08-29 MED ORDER — LOSARTAN POTASSIUM-HCTZ 100-25 MG PO TABS
1.0000 | ORAL_TABLET | Freq: Every day | ORAL | Status: DC
Start: 2015-08-29 — End: 2016-05-29

## 2015-08-29 NOTE — Progress Notes (Signed)
Subjective:  Presents for routine follow-up. No chest pain/ischemic type pain shortness of breath or edema. Active lifestyle. No changes in her diet.  Objective:   BP 136/82 mmHg  Ht 5\' 6"  (1.676 m)  Wt 206 lb 4 oz (93.554 kg)  BMI 33.31 kg/m2 NAD. Alert, oriented. Lungs clear. Heart regular rate rhythm. Lower extremities no edema.   Assessment:  Problem List Items Addressed This Visit      Cardiovascular and Mediastinum   Hypertension - Primary   Relevant Medications   losartan-hydrochlorothiazide (HYZAAR) 100-25 MG tablet   Other Relevant Orders   Basic metabolic panel     Other   Hyperlipidemia   Relevant Medications   losartan-hydrochlorothiazide (HYZAAR) 100-25 MG tablet   Other Relevant Orders   Lipid panel   Hepatic function panel   Prediabetes     Plan:  Meds ordered this encounter  Medications  . losartan-hydrochlorothiazide (HYZAAR) 100-25 MG tablet    Sig: Take 1 tablet by mouth daily.    Dispense:  90 tablet    Refill:  1    Order Specific Question:  Supervising Provider    Answer:  Maggie Font   Lab work pending. Encouraged healthy diet weight loss and regular activity. Also daily vitamin D and calcium. Given prescription for Zostavax. Also recommend tetanus update. Defers flu vaccine. Return in about 6 months (around 02/26/2016) for recheck.

## 2016-02-21 DIAGNOSIS — D485 Neoplasm of uncertain behavior of skin: Secondary | ICD-10-CM | POA: Diagnosis not present

## 2016-02-21 DIAGNOSIS — L570 Actinic keratosis: Secondary | ICD-10-CM | POA: Diagnosis not present

## 2016-03-05 ENCOUNTER — Encounter: Payer: Medicare Other | Admitting: Nurse Practitioner

## 2016-04-02 DIAGNOSIS — H02831 Dermatochalasis of right upper eyelid: Secondary | ICD-10-CM | POA: Diagnosis not present

## 2016-04-02 DIAGNOSIS — H02834 Dermatochalasis of left upper eyelid: Secondary | ICD-10-CM | POA: Diagnosis not present

## 2016-05-28 DIAGNOSIS — H02831 Dermatochalasis of right upper eyelid: Secondary | ICD-10-CM | POA: Diagnosis not present

## 2016-05-28 DIAGNOSIS — H02834 Dermatochalasis of left upper eyelid: Secondary | ICD-10-CM | POA: Diagnosis not present

## 2016-05-29 ENCOUNTER — Other Ambulatory Visit: Payer: Self-pay | Admitting: Nurse Practitioner

## 2016-06-14 ENCOUNTER — Other Ambulatory Visit: Payer: Self-pay | Admitting: Physician Assistant

## 2016-06-14 DIAGNOSIS — D485 Neoplasm of uncertain behavior of skin: Secondary | ICD-10-CM | POA: Diagnosis not present

## 2016-06-14 DIAGNOSIS — D239 Other benign neoplasm of skin, unspecified: Secondary | ICD-10-CM | POA: Diagnosis not present

## 2016-06-14 DIAGNOSIS — L820 Inflamed seborrheic keratosis: Secondary | ICD-10-CM | POA: Diagnosis not present

## 2016-06-14 DIAGNOSIS — L719 Rosacea, unspecified: Secondary | ICD-10-CM | POA: Diagnosis not present

## 2016-09-18 ENCOUNTER — Other Ambulatory Visit: Payer: Self-pay | Admitting: Nurse Practitioner

## 2016-09-18 ENCOUNTER — Encounter: Payer: Self-pay | Admitting: Nurse Practitioner

## 2016-09-18 ENCOUNTER — Ambulatory Visit (INDEPENDENT_AMBULATORY_CARE_PROVIDER_SITE_OTHER): Payer: Medicare Other | Admitting: Nurse Practitioner

## 2016-09-18 VITALS — BP 120/84 | Ht 66.0 in | Wt 206.0 lb

## 2016-09-18 DIAGNOSIS — I10 Essential (primary) hypertension: Secondary | ICD-10-CM

## 2016-09-18 DIAGNOSIS — E785 Hyperlipidemia, unspecified: Secondary | ICD-10-CM | POA: Diagnosis not present

## 2016-09-18 DIAGNOSIS — Z1231 Encounter for screening mammogram for malignant neoplasm of breast: Secondary | ICD-10-CM | POA: Diagnosis not present

## 2016-09-18 DIAGNOSIS — R7303 Prediabetes: Secondary | ICD-10-CM

## 2016-09-18 MED ORDER — LOSARTAN POTASSIUM-HCTZ 100-25 MG PO TABS: 1.0000 | ORAL_TABLET | Freq: Every day | ORAL | 1 refills | Status: DC

## 2016-09-19 ENCOUNTER — Encounter: Payer: Self-pay | Admitting: Nurse Practitioner

## 2016-09-19 LAB — BASIC METABOLIC PANEL
BUN/Creatinine Ratio: 15 (ref 12–28)
BUN: 16 mg/dL (ref 8–27)
CALCIUM: 9.8 mg/dL (ref 8.7–10.3)
CO2: 29 mmol/L (ref 18–29)
CREATININE: 1.1 mg/dL — AB (ref 0.57–1.00)
Chloride: 96 mmol/L (ref 96–106)
GFR calc Af Amer: 60 mL/min/{1.73_m2} (ref >59)
GFR, EST NON AFRICAN AMERICAN: 52 mL/min/{1.73_m2} — AB (ref >59)
GLUCOSE: 82 mg/dL (ref 65–99)
Potassium: 4.2 mmol/L (ref 3.5–5.2)
Sodium: 141 mmol/L (ref 134–144)

## 2016-09-19 LAB — LIPID PANEL
CHOL/HDL RATIO: 4 ratio (ref 0.0–4.4)
CHOLESTEROL TOTAL: 207 mg/dL — AB (ref 100–199)
HDL: 52 mg/dL (ref >39)
LDL CALC: 126 mg/dL — AB (ref 0–99)
TRIGLYCERIDES: 143 mg/dL (ref 0–149)
VLDL Cholesterol Cal: 29 mg/dL (ref 5–40)

## 2016-09-19 LAB — HEPATIC FUNCTION PANEL
ALT: 31 IU/L (ref 0–32)
AST: 19 IU/L (ref 0–40)
Albumin: 4.5 g/dL (ref 3.6–4.8)
Alkaline Phosphatase: 63 IU/L (ref 39–117)
BILIRUBIN, DIRECT: 0.1 mg/dL (ref 0.00–0.40)
Bilirubin Total: 0.5 mg/dL (ref 0.0–1.2)
Total Protein: 7.4 g/dL (ref 6.0–8.5)

## 2016-09-19 LAB — HEMOGLOBIN A1C
Est. average glucose Bld gHb Est-mCnc: 123 mg/dL
HEMOGLOBIN A1C: 5.9 % — AB (ref 4.8–5.6)

## 2016-09-19 NOTE — Progress Notes (Signed)
Subjective:  Presents for routine follow-up. Doing fairly well with her diet. Compliant with blood pressure medication. No chest pain/ischemic type pain or unusual shortness of breath. No edema. Defers all vaccines at this time.  Objective:   BP 120/84   Ht 5\' 6"  (1.676 m)   Wt 206 lb (93.4 kg)   BMI 33.25 kg/m  NAD. Alert, oriented. Lungs clear. Heart regular rate rhythm. Lower extremities trace pitting edema.  Assessment:  Problem List Items Addressed This Visit      Cardiovascular and Mediastinum   Hypertension - Primary   Relevant Medications   losartan-hydrochlorothiazide (HYZAAR) 100-25 MG tablet   Other Relevant Orders   Basic metabolic panel (Completed)   Hepatic function panel (Completed)     Other   Hyperlipidemia   Relevant Medications   losartan-hydrochlorothiazide (HYZAAR) 100-25 MG tablet   Other Relevant Orders   Lipid panel (Completed)   Prediabetes   Relevant Orders   Hemoglobin A1C (Completed)    Other Visit Diagnoses    Visit for screening mammogram       Relevant Orders   MM DIGITAL SCREENING BILATERAL     Plan:  Meds ordered this encounter  Medications  . losartan-hydrochlorothiazide (HYZAAR) 100-25 MG tablet    Sig: Take 1 tablet by mouth daily.    Dispense:  90 tablet    Refill:  1    Order Specific Question:   Supervising Provider    Answer:   Mikey Kirschner [[id number]]   Encouraged patient to get her lab work done as soon as possible. Recommend preventive health physical but patient defers at this time. Return in about 6 months (around 03/18/2017).

## 2016-10-03 ENCOUNTER — Ambulatory Visit (HOSPITAL_COMMUNITY)
Admission: RE | Admit: 2016-10-03 | Discharge: 2016-10-03 | Disposition: A | Discharge status: Home / Self Care | Payer: Medicare Other | Source: Ambulatory Visit | Attending: Nurse Practitioner | Admitting: Nurse Practitioner

## 2016-10-03 ENCOUNTER — Encounter (HOSPITAL_COMMUNITY): Payer: Self-pay

## 2016-10-03 DIAGNOSIS — Z1231 Encounter for screening mammogram for malignant neoplasm of breast: Secondary | ICD-10-CM

## 2017-03-18 ENCOUNTER — Encounter: Payer: Medicare Other | Admitting: Nurse Practitioner

## 2017-03-28 ENCOUNTER — Ambulatory Visit (INDEPENDENT_AMBULATORY_CARE_PROVIDER_SITE_OTHER): Payer: Medicare Other | Admitting: Nurse Practitioner

## 2017-03-28 ENCOUNTER — Encounter: Payer: Self-pay | Admitting: Nurse Practitioner

## 2017-03-28 VITALS — BP 110/72 | Ht 66.0 in | Wt 208.4 lb

## 2017-03-28 DIAGNOSIS — I10 Essential (primary) hypertension: Secondary | ICD-10-CM | POA: Diagnosis not present

## 2017-03-28 MED ORDER — LOSARTAN POTASSIUM-HCTZ 100-25 MG PO TABS: 1.0000 | ORAL_TABLET | Freq: Every day | ORAL | 1 refills | Status: DC

## 2017-03-29 ENCOUNTER — Encounter: Payer: Self-pay | Admitting: Nurse Practitioner

## 2017-03-29 NOTE — Progress Notes (Signed)
Subjective:  Presents for recheck on her hypertension. Originally patient was scheduled for a wellness exam but deferred that today. Agrees to reschedule this in 6 months. No chest pain/ischemic type pain or shortness of breath. No edema. Staying active. Overall healthy diet.  Objective:   BP 110/72   Ht 5\' 6"  (1.676 m)   Wt 208 lb 6.4 oz (94.5 kg)   BMI 33.64 kg/m  NAD. Alert, oriented. Lungs clear. Heart regular rate rhythm. Carotids no bruits or thrills. Lower extremities no edema.  Assessment:   Problem List Items Addressed This Visit      Cardiovascular and Mediastinum   Hypertension - Primary   Relevant Medications   losartan-hydrochlorothiazide (HYZAAR) 100-25 MG tablet       Plan:   Meds ordered this encounter  Medications  . losartan-hydrochlorothiazide (HYZAAR) 100-25 MG tablet    Sig: Take 1 tablet by mouth daily.    Dispense:  90 tablet    Refill:  1    Order Specific Question:   Supervising Provider    Answer:   Mikey Kirschner [[id number]]   Continue current blood pressure pill. Encourage weight loss and regular activity. Strongly encourage mammogram, patient agrees to schedule this at her physical in 6 months. Also yearly lab work at that time. Call back sooner if any problems. Return in about 6 months (around 09/27/2017) for physical.

## 2017-05-07 ENCOUNTER — Other Ambulatory Visit: Payer: Self-pay | Admitting: Physician Assistant

## 2017-05-07 DIAGNOSIS — Z85828 Personal history of other malignant neoplasm of skin: Secondary | ICD-10-CM | POA: Diagnosis not present

## 2017-05-07 DIAGNOSIS — C44612 Basal cell carcinoma of skin of right upper limb, including shoulder: Secondary | ICD-10-CM | POA: Diagnosis not present

## 2017-05-07 DIAGNOSIS — L570 Actinic keratosis: Secondary | ICD-10-CM | POA: Diagnosis not present

## 2017-05-07 DIAGNOSIS — D229 Melanocytic nevi, unspecified: Secondary | ICD-10-CM | POA: Diagnosis not present

## 2017-06-06 DIAGNOSIS — C44612 Basal cell carcinoma of skin of right upper limb, including shoulder: Secondary | ICD-10-CM | POA: Diagnosis not present

## 2017-09-30 ENCOUNTER — Encounter: Payer: Self-pay | Admitting: Nurse Practitioner

## 2017-09-30 ENCOUNTER — Ambulatory Visit (INDEPENDENT_AMBULATORY_CARE_PROVIDER_SITE_OTHER): Payer: Medicare Other | Admitting: Nurse Practitioner

## 2017-09-30 ENCOUNTER — Telehealth: Payer: Self-pay | Admitting: Nurse Practitioner

## 2017-09-30 VITALS — BP 128/82 | Ht 66.0 in | Wt 201.6 lb

## 2017-09-30 DIAGNOSIS — Z23 Encounter for immunization: Secondary | ICD-10-CM

## 2017-09-30 DIAGNOSIS — Z780 Asymptomatic menopausal state: Secondary | ICD-10-CM

## 2017-09-30 DIAGNOSIS — R7303 Prediabetes: Secondary | ICD-10-CM

## 2017-09-30 DIAGNOSIS — E785 Hyperlipidemia, unspecified: Secondary | ICD-10-CM | POA: Diagnosis not present

## 2017-09-30 DIAGNOSIS — Z0001 Encounter for general adult medical examination with abnormal findings: Secondary | ICD-10-CM | POA: Diagnosis not present

## 2017-09-30 DIAGNOSIS — N9089 Other specified noninflammatory disorders of vulva and perineum: Secondary | ICD-10-CM

## 2017-09-30 DIAGNOSIS — I10 Essential (primary) hypertension: Secondary | ICD-10-CM

## 2017-09-30 DIAGNOSIS — Z01419 Encounter for gynecological examination (general) (routine) without abnormal findings: Secondary | ICD-10-CM

## 2017-09-30 MED ORDER — KETOCONAZOLE 2 % EX CREA
1.0000 | TOPICAL_CREAM | Freq: Two times a day (BID) | CUTANEOUS | 4 refills | Status: DC
Start: 2017-09-30 — End: 2020-01-06

## 2017-09-30 MED ORDER — TRIAMCINOLONE ACETONIDE 0.1 % EX CREA: 1.0000 "application " | TOPICAL_CREAM | Freq: Two times a day (BID) | CUTANEOUS | 0 refills | Status: DC

## 2017-09-30 MED ORDER — LOSARTAN POTASSIUM-HCTZ 100-25 MG PO TABS: 1.0000 | ORAL_TABLET | Freq: Every day | ORAL | 1 refills | Status: DC

## 2017-09-30 NOTE — Telephone Encounter (Signed)
Requesting refill on losartan to [place].

## 2017-09-30 NOTE — Telephone Encounter (Signed)
Patient is aware we sent in the rx to [facility].

## 2017-09-30 NOTE — Progress Notes (Signed)
Subjective:    Patient ID: Charlene Thompson, female    DOB: 1950-11-22, 66 y.o.   MRN: [id number]  HPI presents for her wellness exam.  Same sexual partner.  No vaginal bleeding.  No pelvic pain.  No dyspareunia.  Has had some mild itching on the labia since the weather changed.  No pain.  No discharge.  Gets yearly cancer screening through her dermatologist.  Is due for a vision and dental exams.  Stays very active.    Review of Systems  Constitutional: Negative for activity change, appetite change and fatigue.  HENT: Positive for rhinorrhea. Negative for dental problem, ear pain, mouth sores, sinus pressure and sore throat.   Respiratory: Negative for cough, chest tightness, shortness of breath and wheezing.   Cardiovascular: Negative for chest pain.  Gastrointestinal: Negative for abdominal distention, abdominal pain, blood in stool, constipation, diarrhea, nausea and vomiting.  Genitourinary: Negative for difficulty urinating, dyspareunia, dysuria, enuresis, frequency, genital sores, pelvic pain, urgency, vaginal bleeding and vaginal discharge.   Depression screen [facility] 2/9 09/30/2017 03/28/2017 03/06/2015  Decreased Interest 0 0 0  Down, Depressed, Hopeless 0 0 0  PHQ - 2 Score 0 0 0        Objective:   Physical Exam  Constitutional: She is oriented to person, place, and time. She appears well-developed. No distress.  HENT:  Right Ear: External ear normal.  Left Ear: External ear normal.  Mouth/Throat: Oropharynx is clear and moist.  Neck: Normal range of motion. Neck supple. No tracheal deviation present. No thyromegaly present.  Cardiovascular: Normal rate, regular rhythm and normal heart sounds. Exam reveals no gallop.  No murmur heard. Pulmonary/Chest: Effort normal and breath sounds normal. Right breast exhibits no inverted nipple, no mass, no skin change and no tenderness. Left breast exhibits no inverted nipple, no mass, no skin change and no tenderness. Breasts are  symmetrical.  Axilla no adenopathy.  Abdominal: Soft. She exhibits no distension. There is no tenderness.  Genitourinary: Vagina normal and uterus normal. No vaginal discharge found.  Genitourinary Comments: External GU: Confluent well-defined slightly pink minimally raised rash noted on the outer labia.  More on the right, localized.  Nontender, no edema.  Otherwise external vulvar area is normal.  Vagina tissue pink, moist no discharge.  No CMT.  Bimanual exam no tenderness or obvious masses but exam limited due to abdominal girth.  Musculoskeletal: She exhibits no edema.  Lymphadenopathy:    She has no cervical adenopathy.  Neurological: She is alert and oriented to person, place, and time.  Skin: Skin is warm and dry. No rash noted.  Psychiatric: She has a normal mood and affect. Her behavior is normal.  Vitals reviewed.         Assessment & Plan:   Problem List Items Addressed This Visit      Cardiovascular and Mediastinum   Hypertension   Relevant Orders   Basic metabolic panel     Other   Hyperlipidemia   Relevant Orders   Lipid panel   Prediabetes   Relevant Orders   Hemoglobin A1c    Other Visit Diagnoses    Well woman exam    -  Primary   Relevant Orders   Lipid panel   Hepatic function panel   Basic metabolic panel   Hemoglobin A1c   DG Bone Density   Post-menopausal       Relevant Orders   DG Bone Density   Labial irritation  Meds ordered this encounter  Medications  . triamcinolone cream (KENALOG) 0.1 %    Sig: Apply 1 application topically 2 (two) times daily. Prn rash; use up to 2 weeks    Dispense:  30 g    Refill:  0    Order Specific Question:   Supervising Provider    Answer:   Mikey Kirschner [[id number]]  . ketoconazole (NIZORAL) 2 % cream    Sig: Apply 1 application topically 2 (two) times daily. prn    Dispense:  30 g    Refill:  4    Order Specific Question:   Supervising Provider    Answer:   Mikey Kirschner [[id number]]   Call  back in 2-3 weeks if labial irritation has not completely resolved.  Sooner if worse.  Defers flu vaccine and mammogram.  Recommend daily vitamin D and calcium.  Also weightbearing exercises.  Lab work pending. Prevnar 13 vaccine today.  Return in about 6 months (around 03/31/2018) for BP recheck.

## 2017-10-01 LAB — BASIC METABOLIC PANEL
BUN / CREAT RATIO: 14 (ref 12–28)
BUN: 14 mg/dL (ref 8–27)
CO2: 26 mmol/L (ref 20–29)
CREATININE: 0.98 mg/dL (ref 0.57–1.00)
Calcium: 9.9 mg/dL (ref 8.7–10.3)
Chloride: 102 mmol/L (ref 96–106)
GFR calc Af Amer: 69 mL/min/{1.73_m2} (ref >59)
GFR, EST NON AFRICAN AMERICAN: 59 mL/min/{1.73_m2} — AB (ref >59)
GLUCOSE: 104 mg/dL — AB (ref 65–99)
Potassium: 4.1 mmol/L (ref 3.5–5.2)
Sodium: 144 mmol/L (ref 134–144)

## 2017-10-01 LAB — HEMOGLOBIN A1C
ESTIMATED AVERAGE GLUCOSE: 126 mg/dL
HEMOGLOBIN A1C: 6 % — AB (ref 4.8–5.6)

## 2017-10-01 LAB — LIPID PANEL
CHOL/HDL RATIO: 3.6 ratio (ref 0.0–4.4)
Cholesterol, Total: 189 mg/dL (ref 100–199)
HDL: 53 mg/dL (ref >39)
LDL Calculated: 113 mg/dL — ABNORMAL HIGH (ref 0–99)
Triglycerides: 114 mg/dL (ref 0–149)
VLDL Cholesterol Cal: 23 mg/dL (ref 5–40)

## 2017-10-01 LAB — HEPATIC FUNCTION PANEL
ALBUMIN: 4.4 g/dL (ref 3.6–4.8)
ALK PHOS: 60 IU/L (ref 39–117)
ALT: 27 IU/L (ref 0–32)
AST: 17 IU/L (ref 0–40)
BILIRUBIN, DIRECT: 0.11 mg/dL (ref 0.00–0.40)
Bilirubin Total: 0.4 mg/dL (ref 0.0–1.2)
TOTAL PROTEIN: 7.2 g/dL (ref 6.0–8.5)

## 2017-10-08 DIAGNOSIS — D2360 Other benign neoplasm of skin of unspecified upper limb, including shoulder: Secondary | ICD-10-CM | POA: Diagnosis not present

## 2017-10-08 DIAGNOSIS — L570 Actinic keratosis: Secondary | ICD-10-CM | POA: Diagnosis not present

## 2017-10-09 ENCOUNTER — Ambulatory Visit (HOSPITAL_COMMUNITY)
Admission: RE | Admit: 2017-10-09 | Discharge: 2017-10-09 | Disposition: A | Discharge status: Home / Self Care | Payer: Medicare Other | Source: Ambulatory Visit | Attending: Nurse Practitioner | Admitting: Nurse Practitioner

## 2017-10-09 DIAGNOSIS — Z780 Asymptomatic menopausal state: Secondary | ICD-10-CM | POA: Insufficient documentation

## 2017-10-09 DIAGNOSIS — M85852 Other specified disorders of bone density and structure, left thigh: Secondary | ICD-10-CM | POA: Insufficient documentation

## 2017-10-09 DIAGNOSIS — Z01419 Encounter for gynecological examination (general) (routine) without abnormal findings: Secondary | ICD-10-CM | POA: Insufficient documentation

## 2018-03-31 ENCOUNTER — Ambulatory Visit (INDEPENDENT_AMBULATORY_CARE_PROVIDER_SITE_OTHER): Payer: Medicare Other | Admitting: Nurse Practitioner

## 2018-03-31 ENCOUNTER — Encounter: Payer: Self-pay | Admitting: Nurse Practitioner

## 2018-03-31 VITALS — BP 130/84 | Ht 66.0 in | Wt 205.0 lb

## 2018-03-31 DIAGNOSIS — I10 Essential (primary) hypertension: Secondary | ICD-10-CM

## 2018-03-31 DIAGNOSIS — R7303 Prediabetes: Secondary | ICD-10-CM | POA: Diagnosis not present

## 2018-03-31 MED ORDER — LOSARTAN POTASSIUM-HCTZ 100-25 MG PO TABS: 1.0000 | ORAL_TABLET | Freq: Every day | ORAL | 1 refills | Status: DC

## 2018-04-01 ENCOUNTER — Encounter: Payer: Self-pay | Admitting: Nurse Practitioner

## 2018-04-01 NOTE — Progress Notes (Signed)
Subjective: Presents for recheck on her hypertension.  Adherent to medication.  Has a very active lifestyle.  Overall tries to eat healthy. Denies CP/ischemic type pain or SOB. No visual changes. No difficulty speaking or swallowing. No numbness or weakness of the face, arms or legs.  Minimal edema at times.  Went for her mammogram last year which was not a very good experience.  Is reluctant to try again.  Has not noticed any changes in her breast exam.   Objective:   BP 130/84   Ht 5\' 6"  (1.676 m)   Wt 205 lb (93 kg)   BMI 33.09 kg/m  NAD.  Alert, oriented.  Cheerful affect.  Lungs clear.  Heart regular rate and rhythm.  Carotids no bruits or thrills.  Lower extremities no edema.  Assessment:   Problem List Items Addressed This Visit      Cardiovascular and Mediastinum   Hypertension - Primary   Relevant Medications   losartan-hydrochlorothiazide (HYZAAR) 100-25 MG tablet     Other   Prediabetes       Plan:   Meds ordered this encounter  Medications  . losartan-hydrochlorothiazide (HYZAAR) 100-25 MG tablet    Sig: Take 1 tablet by mouth daily.    Dispense:  90 tablet    Refill:  1    Order Specific Question:   Supervising Provider    Answer:   Mikey Kirschner [[id number]]   Recommend healthy diet regular activity and continued weight loss efforts.  Reminded about physical and routine yearly lab work due in December.  Call back sooner if any problems. Return in about 6 months (around 09/30/2018) for physical.

## 2018-05-16 ENCOUNTER — Other Ambulatory Visit: Payer: Self-pay | Admitting: Physician Assistant

## 2018-05-16 DIAGNOSIS — D220 Melanocytic nevi of lip: Secondary | ICD-10-CM | POA: Diagnosis not present

## 2018-05-16 DIAGNOSIS — L570 Actinic keratosis: Secondary | ICD-10-CM | POA: Diagnosis not present

## 2018-05-16 DIAGNOSIS — L820 Inflamed seborrheic keratosis: Secondary | ICD-10-CM | POA: Diagnosis not present

## 2018-05-16 DIAGNOSIS — D485 Neoplasm of uncertain behavior of skin: Secondary | ICD-10-CM | POA: Diagnosis not present

## 2018-06-26 ENCOUNTER — Other Ambulatory Visit: Payer: Self-pay | Admitting: Physician Assistant

## 2018-06-26 DIAGNOSIS — L7682 Other postprocedural complications of skin and subcutaneous tissue: Secondary | ICD-10-CM | POA: Diagnosis not present

## 2018-06-26 DIAGNOSIS — D485 Neoplasm of uncertain behavior of skin: Secondary | ICD-10-CM | POA: Diagnosis not present

## 2018-08-05 ENCOUNTER — Ambulatory Visit (INDEPENDENT_AMBULATORY_CARE_PROVIDER_SITE_OTHER): Payer: Medicare Other

## 2018-08-05 ENCOUNTER — Ambulatory Visit (INDEPENDENT_AMBULATORY_CARE_PROVIDER_SITE_OTHER): Payer: Medicare Other | Admitting: Orthopedic Surgery

## 2018-08-05 ENCOUNTER — Encounter (INDEPENDENT_AMBULATORY_CARE_PROVIDER_SITE_OTHER): Payer: Self-pay | Admitting: Orthopedic Surgery

## 2018-08-05 VITALS — Ht 66.0 in | Wt 205.0 lb

## 2018-08-05 DIAGNOSIS — G8929 Other chronic pain: Secondary | ICD-10-CM

## 2018-08-05 DIAGNOSIS — M25561 Pain in right knee: Secondary | ICD-10-CM | POA: Diagnosis not present

## 2018-08-05 MED ORDER — LIDOCAINE HCL 1 % IJ SOLN
5.0000 mL | INTRAMUSCULAR | Status: AC | PRN
  Administered 2018-08-05: 5 mL

## 2018-08-05 MED ORDER — METHYLPREDNISOLONE ACETATE 40 MG/ML IJ SUSP
40.0000 mg | INTRAMUSCULAR | Status: AC | PRN
  Administered 2018-08-05: 40 mg via INTRA_ARTICULAR

## 2018-08-05 NOTE — Progress Notes (Signed)
Office Visit Note   Patient: Charlene Thompson           Date of Birth: Jan 05, 1951           MRN: [id number] Visit Date: 08/05/2018              Requested by: Kathyrn Drown, MD [address] [place], [place] [address] PCP: Kathyrn Drown, MD  Chief Complaint  Patient presents with  . Right Knee - Pain      HPI: Patient is a 67 year old woman who presents with a 4-week history of right knee pain.  She states she twisted her leg on an acorn and has had global pain around the patella.  She states she has start up stiffness denies any history of giving way denies locking.  Assessment & Plan: Visit Diagnoses:  1. Chronic pain of right knee     Plan: Knee was injected follow-up as needed.  Discussed that if she requires additional intervention we could consider repeat steroid injection versus hyaluronic acid injection.  Follow-Up Instructions: Return if symptoms worsen or fail to improve.   Ortho Exam  Patient is alert, oriented, no adenopathy, well-dressed, normal affect, normal respiratory effort. Examination patient has a normal gait.  The medial and lateral joint lines are nontender to palpation collaterals and cruciates are stable there is crepitation in the patellofemoral joint and the patellofemoral joint is tender to palpation there is no redness no cellulitis.  Imaging: Xr Knee 1-2 Views Right  Result Date: 08/05/2018 2 view radiographs of the right knee shows large osteophytic bone spurs inferior and superior aspects of the patella.  AP view shows subcondylar sclerosis with joint space narrowing that is congruent.  No images are attached to the encounter.  Labs: Lab Results  Component Value Date   HGBA1C 6.0 (H) 09/30/2017   HGBA1C 5.9 (H) 09/18/2016   HGBA1C 5.8 03/04/2015     Lab Results  Component Value Date   ALBUMIN 4.4 09/30/2017   ALBUMIN 4.5 09/18/2016    Body mass index is 33.09 kg/m.  Orders:  Orders Placed This Encounter    Procedures  . XR Knee 1-2 Views Right   No orders of the defined types were placed in this encounter.    Procedures: Large Joint Inj: R knee on 08/05/2018 2:26 PM Indications: pain and diagnostic evaluation Details: 22 G 1.5 in needle, anteromedial approach  Arthrogram: No  Medications: 5 mL lidocaine 1 %; 40 mg methylPREDNISolone acetate 40 MG/ML Outcome: tolerated well, no immediate complications Procedure, treatment alternatives, risks and benefits explained, specific risks discussed. Consent was given by the patient. Immediately prior to procedure a time out was called to verify the correct patient, procedure, equipment, support staff and site/side marked as required. Patient was prepped and draped in the usual sterile fashion.      Clinical Data: No additional findings.  ROS:  All other systems negative, except as noted in the HPI. Review of Systems  Objective: Vital Signs: Ht 5\' 6"  (1.676 m)   Wt 205 lb (93 kg)   BMI 33.09 kg/m   Specialty Comments:  No specialty comments available.  PMFS History: Patient Active Problem List   Diagnosis Date Noted  . Osteopenia 03/10/2015  . Prediabetes 03/06/2015  . Family history of colon cancer 09/03/2014  . Hyperlipidemia 06/07/2014  . Hyperglycemia 06/07/2014  . Hypertension    Past Medical History:  Diagnosis Date  . Basal cell carcinoma   . Hypertension  Family History  Problem Relation Age of Onset  . Diabetes Mother   . Heart disease Mother   . Diabetes Sister        half-sister  . Diabetes Brother        half-brother  . Heart disease Brother   . Kidney disease Sister        half-sister    Past Surgical History:  Procedure Laterality Date  . BASAL CELL CARCINOMA EXCISION     nose  . CARPAL TUNNEL RELEASE Right    Social History   Occupational History  . Occupation: self employed  Tobacco Use  . Smoking status: Never Smoker  . Smokeless tobacco: Never Used  Substance and Sexual Activity   . Alcohol use: Yes    Alcohol/week: 0.0 standard drinks    Comment: occasional  . Drug use: No  . Sexual activity: Yes    Partners: Male    Birth control/protection: Post-menopausal

## 2018-09-09 ENCOUNTER — Ambulatory Visit (INDEPENDENT_AMBULATORY_CARE_PROVIDER_SITE_OTHER): Payer: Medicare Other | Admitting: Orthopedic Surgery

## 2018-09-09 ENCOUNTER — Encounter (INDEPENDENT_AMBULATORY_CARE_PROVIDER_SITE_OTHER): Payer: Self-pay | Admitting: Orthopedic Surgery

## 2018-09-09 VITALS — Ht 66.0 in | Wt 205.0 lb

## 2018-09-09 DIAGNOSIS — M25561 Pain in right knee: Secondary | ICD-10-CM | POA: Diagnosis not present

## 2018-09-09 DIAGNOSIS — M25461 Effusion, right knee: Secondary | ICD-10-CM | POA: Diagnosis not present

## 2018-09-09 DIAGNOSIS — G8929 Other chronic pain: Secondary | ICD-10-CM | POA: Diagnosis not present

## 2018-09-09 MED ORDER — METHYLPREDNISOLONE ACETATE 40 MG/ML IJ SUSP
40.0000 mg | INTRAMUSCULAR | Status: AC | PRN
  Administered 2018-09-09: 40 mg via INTRA_ARTICULAR

## 2018-09-09 MED ORDER — LIDOCAINE HCL 1 % IJ SOLN
5.0000 mL | INTRAMUSCULAR | Status: AC | PRN
  Administered 2018-09-09: 5 mL

## 2018-09-09 NOTE — Progress Notes (Signed)
Office Visit Note   Patient: Charlene Thompson           Date of Birth: 1951/10/01           MRN: [id number] Visit Date: 09/09/2018              Requested by: Kathyrn Drown, MD [address] [place], [place] [address] PCP: Kathyrn Drown, MD  Chief Complaint  Patient presents with  . Right Knee - Pain      HPI: Patient is a 67 year old woman who states that she is still having pain and swelling in the right knee.  She denies any mechanical symptoms with the meniscus states she just has pain from the swelling and decreased range of motion.  Assessment & Plan: Visit Diagnoses:  1. Chronic pain of right knee   2. Effusion, right knee     Plan: The knee was aspirated she tolerated this well she had good improvement of her symptoms after the aspiration and injection.  We will reevaluate in 4 weeks.  Discussed that if she has mechanical symptoms or is point tender over the meniscus we could consider an MRI scan to further evaluate meniscal pathology.  Follow-Up Instructions: Return in about 4 weeks (around 10/07/2018).   Ortho Exam  Patient is alert, oriented, no adenopathy, well-dressed, normal affect, normal respiratory effort. Examination patient has a tense effusion of the right knee.  There is decreased range of motion from 20 to 90 degrees.  There is no redness no cellulitis no signs of infection she has global tenderness in the thigh knee and calf no focal symptoms.  Collaterals and cruciates are stable.  50 cc of clear synovial fluid was aspirated from the superior lateral portal.  Imaging: No results found. No images are attached to the encounter.  Labs: Lab Results  Component Value Date   HGBA1C 6.0 (H) 09/30/2017   HGBA1C 5.9 (H) 09/18/2016   HGBA1C 5.8 03/04/2015     Lab Results  Component Value Date   ALBUMIN 4.4 09/30/2017   ALBUMIN 4.5 09/18/2016    Body mass index is 33.09 kg/m.  Orders:  No orders of the defined types were placed in this  encounter.  No orders of the defined types were placed in this encounter.    Procedures: Large Joint Inj: R knee on 09/09/2018 5:19 PM Indications: pain and diagnostic evaluation Details: 22 G 1.5 in needle, superolateral approach  Arthrogram: No  Medications: 5 mL lidocaine 1 %; 40 mg methylPREDNISolone acetate 40 MG/ML Aspirate: 50 mL clear Outcome: tolerated well, no immediate complications Procedure, treatment alternatives, risks and benefits explained, specific risks discussed. Consent was given by the patient. Immediately prior to procedure a time out was called to verify the correct patient, procedure, equipment, support staff and site/side marked as required. Patient was prepped and draped in the usual sterile fashion.      Clinical Data: No additional findings.  ROS:  All other systems negative, except as noted in the HPI. Review of Systems  Objective: Vital Signs: Ht 5\' 6"  (1.676 m)   Wt 205 lb (93 kg)   BMI 33.09 kg/m   Specialty Comments:  No specialty comments available.  PMFS History: Patient Active Problem List   Diagnosis Date Noted  . Osteopenia 03/10/2015  . Prediabetes 03/06/2015  . Family history of colon cancer 09/03/2014  . Hyperlipidemia 06/07/2014  . Hyperglycemia 06/07/2014  . Hypertension    Past Medical History:  Diagnosis Date  .  Basal cell carcinoma   . Hypertension     Family History  Problem Relation Age of Onset  . Diabetes Mother   . Heart disease Mother   . Diabetes Sister        half-sister  . Diabetes Brother        half-brother  . Heart disease Brother   . Kidney disease Sister        half-sister    Past Surgical History:  Procedure Laterality Date  . BASAL CELL CARCINOMA EXCISION     nose  . CARPAL TUNNEL RELEASE Right    Social History   Occupational History  . Occupation: self employed  Tobacco Use  . Smoking status: Never Smoker  . Smokeless tobacco: Never Used  Substance and Sexual Activity  .  Alcohol use: Yes    Alcohol/week: 0.0 standard drinks    Comment: occasional  . Drug use: No  . Sexual activity: Yes    Partners: Male    Birth control/protection: Post-menopausal

## 2018-09-26 ENCOUNTER — Other Ambulatory Visit: Payer: Self-pay | Admitting: *Deleted

## 2018-09-26 MED ORDER — LOSARTAN POTASSIUM-HCTZ 100-25 MG PO TABS: 1.0000 | ORAL_TABLET | Freq: Every day | ORAL | 0 refills | Status: DC

## 2018-09-30 ENCOUNTER — Other Ambulatory Visit: Payer: Self-pay | Admitting: Physician Assistant

## 2018-09-30 DIAGNOSIS — L820 Inflamed seborrheic keratosis: Secondary | ICD-10-CM | POA: Diagnosis not present

## 2018-09-30 DIAGNOSIS — D485 Neoplasm of uncertain behavior of skin: Secondary | ICD-10-CM | POA: Diagnosis not present

## 2018-10-07 ENCOUNTER — Ambulatory Visit (INDEPENDENT_AMBULATORY_CARE_PROVIDER_SITE_OTHER): Payer: Medicare Other | Admitting: Orthopedic Surgery

## 2018-10-07 ENCOUNTER — Encounter (INDEPENDENT_AMBULATORY_CARE_PROVIDER_SITE_OTHER): Payer: Self-pay | Admitting: Orthopedic Surgery

## 2018-10-07 VITALS — Ht 66.0 in | Wt 205.0 lb

## 2018-10-07 DIAGNOSIS — M25461 Effusion, right knee: Secondary | ICD-10-CM

## 2018-10-07 MED ORDER — METHYLPREDNISOLONE ACETATE 40 MG/ML IJ SUSP
40.0000 mg | INTRAMUSCULAR | Status: AC | PRN
  Administered 2018-10-07: 40 mg via INTRA_ARTICULAR

## 2018-10-07 MED ORDER — LIDOCAINE HCL 1 % IJ SOLN
5.0000 mL | INTRAMUSCULAR | Status: AC | PRN
  Administered 2018-10-07: 5 mL

## 2018-10-07 NOTE — Progress Notes (Signed)
Office Visit Note   Patient: Charlene Thompson           Date of Birth: September 27, 1951           MRN: [id number] Visit Date: 10/07/2018              Requested by: Kathyrn Drown, MD [address] [place], [place] [address] PCP: Kathyrn Drown, MD  Chief Complaint  Patient presents with  . Right Knee - Follow-up    09/09/18 Right knee inj      HPI: The patient is a 67 year old woman who is seen for follow-up of her right knee.  She reports she continues to have pain over the lateral knee radiating down into the lateral calf.  She reports it is worse with turning or twisting.  She denies any locking or popping but her effusion has returned and is very large.  She is having trouble bending and extending the knee due to this.  She reports the last steroid injection only lasted for about 12 hours and then her pain returned.  She reports she has now been dealing with this for nearly 3 months and it is really limiting her activity.  Assessment & Plan: Visit Diagnoses:  1. Effusion, right knee     Plan: After informed consent the right knee was sterilely prepped with Betadine and anesthetized with 1% Xylocaine and then aspirated for a large serous effusion approximately 50 cc.  The fluid was clear.  There are no signs of infection.  Cortisone was again injected.  We will refer her at this point for an MRI scan as we suspect she may have a lateral meniscal tear which may require arthroscopic debridement. She will follow-up following results of her MRI scan.  Follow-Up Instructions: Return in about 2 weeks (around 10/21/2018).   Ortho Exam  Patient is alert, oriented, no adenopathy, well-dressed, normal affect, normal respiratory effort. The right knee has a very large effusion as previously noted.  She is tender over the lateral joint line to palpation.  She has difficulty with pain on full extension flexion but good functional range of motion.  There is no instability.  She has good  pedal pulses. After informed consent the right knee was aspirated under sterile techniques for approximately 50 cc of clear yellow serous fluid and the patient tolerated this well.  Steroid was then injected.  Imaging: No results found. No images are attached to the encounter.  Labs: Lab Results  Component Value Date   HGBA1C 6.0 (H) 09/30/2017   HGBA1C 5.9 (H) 09/18/2016   HGBA1C 5.8 03/04/2015     Lab Results  Component Value Date   ALBUMIN 4.4 09/30/2017   ALBUMIN 4.5 09/18/2016    Body mass index is 33.09 kg/m.  Orders:  Orders Placed This Encounter  Procedures  . Large Joint Inj  . MR Knee Right w/o contrast   No orders of the defined types were placed in this encounter.    Procedures: Large Joint Inj: R knee on 10/07/2018 1:45 PM Indications: pain and diagnostic evaluation Details: 22 G 1.5 in needle, superolateral approach  Arthrogram: No  Medications: 5 mL lidocaine 1 %; 40 mg methylPREDNISolone acetate 40 MG/ML Aspirate: 50 mL clear, yellow and serous Outcome: tolerated well, no immediate complications Procedure, treatment alternatives, risks and benefits explained, specific risks discussed. Consent was given by the patient. Immediately prior to procedure a time out was called to verify the correct patient, procedure, equipment, support  staff and site/side marked as required. Patient was prepped and draped in the usual sterile fashion.      Clinical Data: No additional findings.  ROS:  All other systems negative, except as noted in the HPI. Review of Systems  Objective: Vital Signs: Ht 5\' 6"  (1.676 m)   Wt 205 lb (93 kg)   BMI 33.09 kg/m   Specialty Comments:  No specialty comments available.  PMFS History: Patient Active Problem List   Diagnosis Date Noted  . Osteopenia 03/10/2015  . Prediabetes 03/06/2015  . Family history of colon cancer 09/03/2014  . Hyperlipidemia 06/07/2014  . Hyperglycemia 06/07/2014  . Hypertension    Past  Medical History:  Diagnosis Date  . Basal cell carcinoma   . Hypertension     Family History  Problem Relation Age of Onset  . Diabetes Mother   . Heart disease Mother   . Diabetes Sister        half-sister  . Diabetes Brother        half-brother  . Heart disease Brother   . Kidney disease Sister        half-sister    Past Surgical History:  Procedure Laterality Date  . BASAL CELL CARCINOMA EXCISION     nose  . CARPAL TUNNEL RELEASE Right    Social History   Occupational History  . Occupation: self employed  Tobacco Use  . Smoking status: Never Smoker  . Smokeless tobacco: Never Used  Substance and Sexual Activity  . Alcohol use: Yes    Alcohol/week: 0.0 standard drinks    Comment: occasional  . Drug use: No  . Sexual activity: Yes    Partners: Male    Birth control/protection: Post-menopausal

## 2018-10-14 ENCOUNTER — Ambulatory Visit (HOSPITAL_COMMUNITY)
Admission: RE | Admit: 2018-10-14 | Discharge: 2018-10-14 | Disposition: A | Discharge status: Home / Self Care | Payer: Medicare Other | Source: Ambulatory Visit | Attending: Physician Assistant | Admitting: Physician Assistant

## 2018-10-14 DIAGNOSIS — X58XXXA Exposure to other specified factors, initial encounter: Secondary | ICD-10-CM | POA: Insufficient documentation

## 2018-10-14 DIAGNOSIS — M948X6 Other specified disorders of cartilage, lower leg: Secondary | ICD-10-CM | POA: Diagnosis not present

## 2018-10-14 DIAGNOSIS — M23221 Derangement of posterior horn of medial meniscus due to old tear or injury, right knee: Secondary | ICD-10-CM | POA: Diagnosis not present

## 2018-10-14 DIAGNOSIS — S83281A Other tear of lateral meniscus, current injury, right knee, initial encounter: Secondary | ICD-10-CM | POA: Diagnosis not present

## 2018-10-14 DIAGNOSIS — M25461 Effusion, right knee: Secondary | ICD-10-CM | POA: Diagnosis not present

## 2018-11-03 ENCOUNTER — Ambulatory Visit (INDEPENDENT_AMBULATORY_CARE_PROVIDER_SITE_OTHER): Payer: Medicare Other | Admitting: Orthopedic Surgery

## 2018-11-03 ENCOUNTER — Encounter (INDEPENDENT_AMBULATORY_CARE_PROVIDER_SITE_OTHER): Payer: Self-pay | Admitting: Orthopedic Surgery

## 2018-11-03 VITALS — Ht 66.0 in | Wt 205.0 lb

## 2018-11-03 DIAGNOSIS — S83281S Other tear of lateral meniscus, current injury, right knee, sequela: Secondary | ICD-10-CM | POA: Diagnosis not present

## 2018-11-03 DIAGNOSIS — M25461 Effusion, right knee: Secondary | ICD-10-CM

## 2018-11-03 NOTE — Progress Notes (Signed)
Office Visit Note   Patient: Charlene Thompson           Date of Birth: 1950-12-05           MRN: [id number] Visit Date: 11/03/2018              Requested by: Kathyrn Drown, MD [address] [place], [place] [address] PCP: Kathyrn Drown, MD  Chief Complaint  Patient presents with  . Right Knee - Follow-up    S?P MRI review       HPI: Patient is a 68 year old woman who presents with recurrent effusion of the right knee she is status post aspiration with persistent recurrent effusion patient complains of pain primarily over the lateral joint line.  She is status post MRI scan she states she is still symptomatic after her acute twisting injury.  Assessment & Plan: Visit Diagnoses:  1. Effusion, right knee   2. Acute lateral meniscus tear of right knee, sequela     Plan: Discussed treatment options.  Patient states she would like to proceed with arthroscopy risks and benefits were discussed including approximately 50% chance that she would still have pain from the arthritis.  Patient states she understands she feels like her acute meniscal injury if treated should resolve her symptoms.  Plan for outpatient surgery.  Follow-Up Instructions: Return in about 2 weeks (around 11/17/2018).   Ortho Exam  Patient is alert, oriented, no adenopathy, well-dressed, normal affect, normal respiratory effort. Examination patient has an antalgic gait she has recurrent effusion of the right knee she is tender to palpation over the lateral and medial joint line crepitation with range of motion review of the MRI scan shows tricompartmental arthritic changes worse in the lateral joint line with a tear of the lateral meniscus.  Imaging: No results found. No images are attached to the encounter.  Labs: Lab Results  Component Value Date   HGBA1C 6.0 (H) 09/30/2017   HGBA1C 5.9 (H) 09/18/2016   HGBA1C 5.8 03/04/2015     Lab Results  Component Value Date   ALBUMIN 4.4 09/30/2017   ALBUMIN 4.5 09/18/2016    Body mass index is 33.09 kg/m.  Orders:  No orders of the defined types were placed in this encounter.  No orders of the defined types were placed in this encounter.    Procedures: No procedures performed  Clinical Data: No additional findings.  ROS:  All other systems negative, except as noted in the HPI. Review of Systems  Objective: Vital Signs: Ht 5\' 6"  (1.676 m)   Wt 205 lb (93 kg)   BMI 33.09 kg/m   Specialty Comments:  No specialty comments available.  PMFS History: Patient Active Problem List   Diagnosis Date Noted  . Osteopenia 03/10/2015  . Prediabetes 03/06/2015  . Family history of colon cancer 09/03/2014  . Hyperlipidemia 06/07/2014  . Hyperglycemia 06/07/2014  . Hypertension    Past Medical History:  Diagnosis Date  . Basal cell carcinoma   . Hypertension     Family History  Problem Relation Age of Onset  . Diabetes Mother   . Heart disease Mother   . Diabetes Sister        half-sister  . Diabetes Brother        half-brother  . Heart disease Brother   . Kidney disease Sister        half-sister    Past Surgical History:  Procedure Laterality Date  . BASAL CELL CARCINOMA EXCISION  nose  . CARPAL TUNNEL RELEASE Right    Social History   Occupational History  . Occupation: self employed  Tobacco Use  . Smoking status: Never Smoker  . Smokeless tobacco: Never Used  Substance and Sexual Activity  . Alcohol use: Yes    Alcohol/week: 0.0 standard drinks    Comment: occasional  . Drug use: No  . Sexual activity: Yes    Partners: Male    Birth control/protection: Post-menopausal

## 2018-11-25 ENCOUNTER — Other Ambulatory Visit (INDEPENDENT_AMBULATORY_CARE_PROVIDER_SITE_OTHER): Payer: Self-pay | Admitting: Orthopedic Surgery

## 2018-11-25 DIAGNOSIS — S83211A Bucket-handle tear of medial meniscus, current injury, right knee, initial encounter: Secondary | ICD-10-CM | POA: Diagnosis not present

## 2018-11-25 DIAGNOSIS — S83261A Peripheral tear of lateral meniscus, current injury, right knee, initial encounter: Secondary | ICD-10-CM | POA: Diagnosis not present

## 2018-11-25 DIAGNOSIS — M23221 Derangement of posterior horn of medial meniscus due to old tear or injury, right knee: Secondary | ICD-10-CM | POA: Diagnosis not present

## 2018-11-25 DIAGNOSIS — G8918 Other acute postprocedural pain: Secondary | ICD-10-CM | POA: Diagnosis not present

## 2018-11-25 DIAGNOSIS — M23241 Derangement of anterior horn of lateral meniscus due to old tear or injury, right knee: Secondary | ICD-10-CM | POA: Diagnosis not present

## 2018-11-25 MED ORDER — HYDROCODONE-ACETAMINOPHEN 5-325 MG PO TABS: 1.0000 | ORAL_TABLET | Freq: Four times a day (QID) | ORAL | 0 refills | Status: DC | PRN

## 2018-12-08 ENCOUNTER — Ambulatory Visit (INDEPENDENT_AMBULATORY_CARE_PROVIDER_SITE_OTHER): Payer: Medicare Other | Admitting: Physician Assistant

## 2018-12-08 ENCOUNTER — Encounter (INDEPENDENT_AMBULATORY_CARE_PROVIDER_SITE_OTHER): Payer: Self-pay | Admitting: Physician Assistant

## 2018-12-08 VITALS — Ht 66.0 in | Wt 205.0 lb

## 2018-12-08 DIAGNOSIS — Z9889 Other specified postprocedural states: Secondary | ICD-10-CM

## 2018-12-08 NOTE — Progress Notes (Signed)
Office Visit Note   Patient: Charlene Thompson           Date of Birth: 1951/07/14           MRN: [id number] Visit Date: 12/08/2018              Requested by: Kathyrn Drown, MD [address] [place], [place] [address] PCP: Kathyrn Drown, MD  Chief Complaint  Patient presents with  . Right Knee - Routine Post Op    11/25/2018 right knee scope and debridement       HPI: The patient is a 68 year old woman who is seen for postoperative follow-up following right knee arthroscopic debridement on 11/25/2018.  She reports that she did well following surgery and only had to take 1 pain pill.  She is pleased with the knee motion and reports the discomfort is much improved but she feels like something got wrenched in her ankle at the time of surgery.  She reports that her ankle feels like it sprained.  She has not been taking any medication for this however and not walking with an assistive device.  Assessment & Plan: Visit Diagnoses:  1. S/P right knee arthroscopy     Plan: Sutures were harvested this visit.  Patient will work on straight leg raising and progression with her ambulation weightbearing as tolerated on the right knee.  She will continue to monitor her symptoms with the right ankle and call should she continue to have difficulty with this.  Exam today was negative.  She will follow-up here in 2 weeks or sooner should she have difficulty in the interim.  Follow-Up Instructions: Return in about 2 weeks (around 12/22/2018).   Ortho Exam  Patient is alert, oriented, no adenopathy, well-dressed, normal affect, normal respiratory effort. Right knee port sites are healing well and sutures were removed this visit.  There is very mild right effusion.  Her knee range of motion shows full extension and at least 110 degrees of flexion.  No signs of cellulitis or infection.  She has some discomfort about the right ankle laterally to palpation but no instability.  There is no ecchymosis  or edema.  She has good pedal pulses.  Imaging: No results found. No images are attached to the encounter.  Labs: Lab Results  Component Value Date   HGBA1C 6.0 (H) 09/30/2017   HGBA1C 5.9 (H) 09/18/2016   HGBA1C 5.8 03/04/2015     Lab Results  Component Value Date   ALBUMIN 4.4 09/30/2017   ALBUMIN 4.5 09/18/2016    Body mass index is 33.09 kg/m.  Orders:  No orders of the defined types were placed in this encounter.  No orders of the defined types were placed in this encounter.    Procedures: No procedures performed  Clinical Data: No additional findings.  ROS:  All other systems negative, except as noted in the HPI. Review of Systems  Objective: Vital Signs: Ht 5\' 6"  (1.676 m)   Wt 205 lb (93 kg)   BMI 33.09 kg/m   Specialty Comments:  No specialty comments available.  PMFS History: Patient Active Problem List   Diagnosis Date Noted  . Osteopenia 03/10/2015  . Prediabetes 03/06/2015  . Family history of colon cancer 09/03/2014  . Hyperlipidemia 06/07/2014  . Hyperglycemia 06/07/2014  . Hypertension    Past Medical History:  Diagnosis Date  . Basal cell carcinoma   . Hypertension     Family History  Problem Relation Age of  Onset  . Diabetes Mother   . Heart disease Mother   . Diabetes Sister        half-sister  . Diabetes Brother        half-brother  . Heart disease Brother   . Kidney disease Sister        half-sister    Past Surgical History:  Procedure Laterality Date  . BASAL CELL CARCINOMA EXCISION     nose  . CARPAL TUNNEL RELEASE Right    Social History   Occupational History  . Occupation: self employed  Tobacco Use  . Smoking status: Never Smoker  . Smokeless tobacco: Never Used  Substance and Sexual Activity  . Alcohol use: Yes    Alcohol/week: 0.0 standard drinks    Comment: occasional  . Drug use: No  . Sexual activity: Yes    Partners: Male    Birth control/protection: Post-menopausal

## 2018-12-18 ENCOUNTER — Ambulatory Visit (INDEPENDENT_AMBULATORY_CARE_PROVIDER_SITE_OTHER): Payer: Medicare Other | Admitting: Family Medicine

## 2018-12-18 ENCOUNTER — Encounter: Payer: Self-pay | Admitting: Family Medicine

## 2018-12-18 VITALS — BP 132/84 | Ht 66.0 in | Wt 208.4 lb

## 2018-12-18 DIAGNOSIS — R7303 Prediabetes: Secondary | ICD-10-CM

## 2018-12-18 DIAGNOSIS — E785 Hyperlipidemia, unspecified: Secondary | ICD-10-CM | POA: Diagnosis not present

## 2018-12-18 DIAGNOSIS — I10 Essential (primary) hypertension: Secondary | ICD-10-CM | POA: Diagnosis not present

## 2018-12-18 MED ORDER — LOSARTAN POTASSIUM-HCTZ 100-25 MG PO TABS: 1.0000 | ORAL_TABLET | Freq: Every day | ORAL | 1 refills | Status: DC

## 2018-12-18 NOTE — Patient Instructions (Signed)
DASH Eating Plan  DASH stands for "Dietary Approaches to Stop Hypertension." The DASH eating plan is a healthy eating plan that has been shown to reduce high blood pressure (hypertension). It may also reduce your risk for type 2 diabetes, heart disease, and stroke. The DASH eating plan may also help with weight loss.  What are tips for following this plan?    General guidelines   Avoid eating more than 2,300 mg (milligrams) of salt (sodium) a day. If you have hypertension, you may need to reduce your sodium intake to 1,500 mg a day.   Limit alcohol intake to no more than 1 drink a day for nonpregnant women and 2 drinks a day for men. One drink equals 12 oz of beer, 5 oz of wine, or 1 oz of hard liquor.   Work with your health care provider to maintain a healthy body weight or to lose weight. Ask what an ideal weight is for you.   Get at least 30 minutes of exercise that causes your heart to beat faster (aerobic exercise) most days of the week. Activities may include walking, swimming, or biking.   Work with your health care provider or diet and nutrition specialist (dietitian) to adjust your eating plan to your individual calorie needs.  Reading food labels     Check food labels for the amount of sodium per serving. Choose foods with less than 5 percent of the Daily Value of sodium. Generally, foods with less than 300 mg of sodium per serving fit into this eating plan.   To find whole grains, look for the word "whole" as the first word in the ingredient list.  Shopping   Buy products labeled as "low-sodium" or "no salt added."   Buy fresh foods. Avoid canned foods and premade or frozen meals.  Cooking   Avoid adding salt when cooking. Use salt-free seasonings or herbs instead of table salt or sea salt. Check with your health care provider or pharmacist before using salt substitutes.   Do not fry foods. Cook foods using healthy methods such as baking, boiling, grilling, and broiling instead.   Cook with  heart-healthy oils, such as olive, canola, soybean, or sunflower oil.  Meal planning   Eat a balanced diet that includes:  ? 5 or more servings of fruits and vegetables each day. At each meal, try to fill half of your plate with fruits and vegetables.  ? Up to 6-8 servings of whole grains each day.  ? Less than 6 oz of lean meat, poultry, or fish each day. A 3-oz serving of meat is about the same size as a deck of cards. One egg equals 1 oz.  ? 2 servings of low-fat dairy each day.  ? A serving of nuts, seeds, or beans 5 times each week.  ? Heart-healthy fats. Healthy fats called Omega-3 fatty acids are found in foods such as flaxseeds and coldwater fish, like sardines, salmon, and mackerel.   Limit how much you eat of the following:  ? Canned or prepackaged foods.  ? Food that is high in trans fat, such as fried foods.  ? Food that is high in saturated fat, such as fatty meat.  ? Sweets, desserts, sugary drinks, and other foods with added sugar.  ? Full-fat dairy products.   Do not salt foods before eating.   Try to eat at least 2 vegetarian meals each week.   Eat more home-cooked food and less restaurant, buffet, and fast food.     When eating at a restaurant, ask that your food be prepared with less salt or no salt, if possible.  What foods are recommended?  The items listed may not be a complete list. Talk with your dietitian about what dietary choices are best for you.  Grains  Whole-grain or whole-wheat bread. Whole-grain or whole-wheat pasta. Brown rice. Oatmeal. Quinoa. Bulgur. Whole-grain and low-sodium cereals. Pita bread. Low-fat, low-sodium crackers. Whole-wheat flour tortillas.  Vegetables  Fresh or frozen vegetables (raw, steamed, roasted, or grilled). Low-sodium or reduced-sodium tomato and vegetable juice. Low-sodium or reduced-sodium tomato sauce and tomato paste. Low-sodium or reduced-sodium canned vegetables.  Fruits  All fresh, dried, or frozen fruit. Canned fruit in natural juice (without  added sugar).  Meat and other protein foods  Skinless chicken or turkey. Ground chicken or turkey. Pork with fat trimmed off. Fish and seafood. Egg whites. Dried beans, peas, or lentils. Unsalted nuts, nut butters, and seeds. Unsalted canned beans. Lean cuts of beef with fat trimmed off. Low-sodium, lean deli meat.  Dairy  Low-fat (1%) or fat-free (skim) milk. Fat-free, low-fat, or reduced-fat cheeses. Nonfat, low-sodium ricotta or cottage cheese. Low-fat or nonfat yogurt. Low-fat, low-sodium cheese.  Fats and oils  Soft margarine without trans fats. Vegetable oil. Low-fat, reduced-fat, or light mayonnaise and salad dressings (reduced-sodium). Canola, safflower, olive, soybean, and sunflower oils. Avocado.  Seasoning and other foods  Herbs. Spices. Seasoning mixes without salt. Unsalted popcorn and pretzels. Fat-free sweets.  What foods are not recommended?  The items listed may not be a complete list. Talk with your dietitian about what dietary choices are best for you.  Grains  Baked goods made with fat, such as croissants, muffins, or some breads. Dry pasta or rice meal packs.  Vegetables  Creamed or fried vegetables. Vegetables in a cheese sauce. Regular canned vegetables (not low-sodium or reduced-sodium). Regular canned tomato sauce and paste (not low-sodium or reduced-sodium). Regular tomato and vegetable juice (not low-sodium or reduced-sodium). Pickles. Olives.  Fruits  Canned fruit in a light or heavy syrup. Fried fruit. Fruit in cream or butter sauce.  Meat and other protein foods  Fatty cuts of meat. Ribs. Fried meat. Bacon. Sausage. Bologna and other processed lunch meats. Salami. Fatback. Hotdogs. Bratwurst. Salted nuts and seeds. Canned beans with added salt. Canned or smoked fish. Whole eggs or egg yolks. Chicken or turkey with skin.  Dairy  Whole or 2% milk, cream, and half-and-half. Whole or full-fat cream cheese. Whole-fat or sweetened yogurt. Full-fat cheese. Nondairy creamers. Whipped toppings.  Processed cheese and cheese spreads.  Fats and oils  Butter. Stick margarine. Lard. Shortening. Ghee. Bacon fat. Tropical oils, such as coconut, palm kernel, or palm oil.  Seasoning and other foods  Salted popcorn and pretzels. Onion salt, garlic salt, seasoned salt, table salt, and sea salt. Worcestershire sauce. Tartar sauce. Barbecue sauce. Teriyaki sauce. Soy sauce, including reduced-sodium. Steak sauce. Canned and packaged gravies. Fish sauce. Oyster sauce. Cocktail sauce. Horseradish that you find on the shelf. Ketchup. Mustard. Meat flavorings and tenderizers. Bouillon cubes. Hot sauce and Tabasco sauce. Premade or packaged marinades. Premade or packaged taco seasonings. Relishes. Regular salad dressings.  Where to find more information:   National Heart, Lung, and Blood Institute: www.nhlbi.nih.gov   American Heart Association: www.heart.org  Summary   The DASH eating plan is a healthy eating plan that has been shown to reduce high blood pressure (hypertension). It may also reduce your risk for type 2 diabetes, heart disease, and stroke.   With the   DASH eating plan, you should limit salt (sodium) intake to 2,300 mg a day. If you have hypertension, you may need to reduce your sodium intake to 1,500 mg a day.   When on the DASH eating plan, aim to eat more fresh fruits and vegetables, whole grains, lean proteins, low-fat dairy, and heart-healthy fats.   Work with your health care provider or diet and nutrition specialist (dietitian) to adjust your eating plan to your individual calorie needs.  This information is not intended to replace advice given to you by your health care provider. Make sure you discuss any questions you have with your health care provider.  Document Released: 10/04/2011 Document Revised: 10/08/2016 Document Reviewed: 10/08/2016  Elsevier Interactive Patient Education  2019 [employer]

## 2018-12-18 NOTE — Progress Notes (Signed)
   Subjective:    Patient ID: Charlene Thompson, female    DOB: 1951/02/04, 68 y.o.   MRN: [id number]  Hypertension  This is a chronic problem. The current episode started more than 1 year ago. Pertinent negatives include no chest pain, headaches or shortness of breath. Risk factors for coronary artery disease include post-menopausal state. Treatments tried: hyzaar. There are no compliance problems.    Taking med everyday, Doesn't try to eat healthy, stays busy with work, but no regular exercise. Denies any adverse effects.    Review of Systems  Constitutional: Negative for fever and unexpected weight change.  Eyes: Negative for visual disturbance.  Respiratory: Negative for shortness of breath.   Cardiovascular: Negative for chest pain.  Neurological: Negative for syncope and headaches.       Objective:   Physical Exam Vitals signs and nursing note reviewed.  Constitutional:      General: She is not in acute distress.    Appearance: She is well-developed.  HENT:     Head: Normocephalic and atraumatic.  Neck:     Musculoskeletal: Neck supple.  Cardiovascular:     Rate and Rhythm: Normal rate and regular rhythm.     Heart sounds: Normal heart sounds. No murmur.  Pulmonary:     Effort: Pulmonary effort is normal. No respiratory distress.     Breath sounds: Normal breath sounds.  Musculoskeletal:     Right lower leg: No edema.     Left lower leg: No edema.  Skin:    General: Skin is warm and dry.  Neurological:     Mental Status: She is alert and oriented to person, place, and time.  Psychiatric:        Behavior: Behavior normal.           Assessment & Plan:  1. Essential hypertension - Plan: Hepatic function panel, Basic metabolic panel HTN- Patient was seen today as part of a visit regarding hypertension. Discussed the importance of a healthy diet and regular physical activity, as well as the importance of compliance with medications.  Ideal goal is to keep blood  pressure low, elevated levels certainly below 025/85 when possible.  The patient was counseled that keeping blood pressure under control will lessen the risk of complications. Low-salt diet such as DASH recommended, as well as regular physical activity. Patient was advised to keep regular follow-ups.  2. Prediabetes - Plan: Hemoglobin A1c Pt not currently on any medications for this. Will recheck A1c and f/u based on results.   3. Hyperlipidemia, unspecified hyperlipidemia type - Plan: Lipid panel Pt not currently on any medications. Will check lipid and liver function and f/u based on results.  Recommend f/u 6 months.

## 2018-12-22 ENCOUNTER — Other Ambulatory Visit: Payer: Self-pay | Admitting: Family Medicine

## 2018-12-22 ENCOUNTER — Encounter (INDEPENDENT_AMBULATORY_CARE_PROVIDER_SITE_OTHER): Payer: Self-pay | Admitting: Physician Assistant

## 2018-12-22 ENCOUNTER — Ambulatory Visit (INDEPENDENT_AMBULATORY_CARE_PROVIDER_SITE_OTHER): Payer: Medicare Other | Admitting: Physician Assistant

## 2018-12-22 VITALS — Ht 66.0 in | Wt 208.4 lb

## 2018-12-22 DIAGNOSIS — Z9889 Other specified postprocedural states: Secondary | ICD-10-CM | POA: Diagnosis not present

## 2018-12-22 DIAGNOSIS — M25561 Pain in right knee: Secondary | ICD-10-CM | POA: Diagnosis not present

## 2018-12-22 DIAGNOSIS — S83281S Other tear of lateral meniscus, current injury, right knee, sequela: Secondary | ICD-10-CM

## 2018-12-22 MED ORDER — METHYLPREDNISOLONE ACETATE 40 MG/ML IJ SUSP
40.0000 mg | INTRAMUSCULAR | Status: AC | PRN
  Administered 2018-12-22: 40 mg via INTRA_ARTICULAR

## 2018-12-22 MED ORDER — LIDOCAINE HCL 1 % IJ SOLN
5.0000 mL | INTRAMUSCULAR | Status: AC | PRN
  Administered 2018-12-22: 5 mL

## 2018-12-22 NOTE — Progress Notes (Signed)
Office Visit Note   Patient: Charlene Thompson           Date of Birth: [birth year]           MRN: [id number] Visit Date: 12/22/2018              Requested by: Kathyrn Drown, MD [address] [place], [place] [address] PCP: Kathyrn Drown, MD  Chief Complaint  Patient presents with  . Right Knee - Routine Post Op    11/25/2018 Scope & Deb      HPI: The patient is a 68 year old woman who is seen for postoperative follow-up following right knee arthroscopic debridement on 11/25/2018 for a lateral meniscal tear.  She reports that she has developed a recurrent effusion and pain over the area.  She stated the pain feels like it did prior to surgery.  She has been taking Tylenol or ibuprofen only for pain.  Assessment & Plan: Visit Diagnoses:  1. S/P right knee arthroscopy   2. Acute lateral meniscus tear of right knee, sequela     Plan: After informed consent the right knee was aspirated by Dr. Sharol Given for 60 cc of yellow clear serous fluid under sterile techniques and then injected with lidocaine and Depo-Medrol.  She tolerated this well.  She was instructed to work on straight leg raising exercises for quadricep strengthening.  She will follow-up in 3 weeks.  Follow-Up Instructions: Return in about 3 weeks (around 01/12/2019).   Ortho Exam  Patient is alert, oriented, no adenopathy, well-dressed, normal affect, normal respiratory effort. The right knee is effused and range of motion is full extension and 105 to 110 degrees of flexion with pain on end flexion.  She complains of pain or pain about the lateral joint line.  Bilateral ports are well-healed.  There are no signs of cellulitis or infection.  After informed consent Dr. Sharol Given aspirated the right knee for 60 cc of clear serous appearing fluid and then injected with lidocaine and Depo-Medrol under sterile techniques.  The patient tolerated this well.  Imaging: No results found. No images are attached to the  encounter.  Labs: Lab Results  Component Value Date   HGBA1C 6.0 (H) 09/30/2017   HGBA1C 5.9 (H) 09/18/2016   HGBA1C 5.8 03/04/2015     Lab Results  Component Value Date   ALBUMIN 4.4 09/30/2017   ALBUMIN 4.5 09/18/2016    Body mass index is 33.64 kg/m.  Orders:  Orders Placed This Encounter  Procedures  . Large Joint Inj   No orders of the defined types were placed in this encounter.    Procedures: Large Joint Inj: R knee on 12/22/2018 3:35 PM Indications: pain and diagnostic evaluation Details: 22 G 1.5 in needle, superolateral approach  Arthrogram: No  Medications: 5 mL lidocaine 1 %; 40 mg methylPREDNISolone acetate 40 MG/ML Aspirate: 60 mL clear and serous Outcome: tolerated well, no immediate complications Procedure, treatment alternatives, risks and benefits explained, specific risks discussed. Consent was given by the patient. Immediately prior to procedure a time out was called to verify the correct patient, procedure, equipment, support staff and site/side marked as required. Patient was prepped and draped in the usual sterile fashion.      Clinical Data: No additional findings.  ROS:  All other systems negative, except as noted in the HPI. Review of Systems  Objective: Vital Signs: Ht 5\' 6"  (1.676 m)   Wt 208 lb 6.4 oz (94.5 kg)   BMI 33.64  kg/m   Specialty Comments:  No specialty comments available.  PMFS History: Patient Active Problem List   Diagnosis Date Noted  . Osteopenia 03/10/2015  . Prediabetes 03/06/2015  . Family history of colon cancer 09/03/2014  . Hyperlipidemia 06/07/2014  . Hyperglycemia 06/07/2014  . Hypertension    Past Medical History:  Diagnosis Date  . Basal cell carcinoma   . Hypertension     Family History  Problem Relation Age of Onset  . Diabetes Mother   . Heart disease Mother   . Diabetes Sister        half-sister  . Diabetes Brother        half-brother  . Heart disease Brother   . Kidney disease  Sister        half-sister    Past Surgical History:  Procedure Laterality Date  . BASAL CELL CARCINOMA EXCISION     nose  . CARPAL TUNNEL RELEASE Right    Social History   Occupational History  . Occupation: self employed  Tobacco Use  . Smoking status: Never Smoker  . Smokeless tobacco: Never Used  Substance and Sexual Activity  . Alcohol use: Yes    Alcohol/week: 0.0 standard drinks    Comment: occasional  . Drug use: No  . Sexual activity: Yes    Partners: Male    Birth control/protection: Post-menopausal

## 2019-01-13 ENCOUNTER — Encounter (INDEPENDENT_AMBULATORY_CARE_PROVIDER_SITE_OTHER): Payer: Self-pay | Admitting: Orthopedic Surgery

## 2019-01-13 ENCOUNTER — Other Ambulatory Visit: Payer: Self-pay

## 2019-01-13 ENCOUNTER — Ambulatory Visit (INDEPENDENT_AMBULATORY_CARE_PROVIDER_SITE_OTHER): Payer: Medicare Other | Admitting: Orthopedic Surgery

## 2019-01-13 VITALS — Ht 66.0 in | Wt 208.4 lb

## 2019-01-13 DIAGNOSIS — Z9889 Other specified postprocedural states: Secondary | ICD-10-CM

## 2019-01-13 DIAGNOSIS — M25461 Effusion, right knee: Secondary | ICD-10-CM

## 2019-01-13 MED ORDER — LIDOCAINE HCL 1 % IJ SOLN
5.0000 mL | INTRAMUSCULAR | Status: AC | PRN
  Administered 2019-01-13: 5 mL

## 2019-01-13 MED ORDER — METHYLPREDNISOLONE ACETATE 40 MG/ML IJ SUSP
40.0000 mg | INTRAMUSCULAR | Status: AC | PRN
  Administered 2019-01-13: 40 mg via INTRA_ARTICULAR

## 2019-01-13 NOTE — Progress Notes (Signed)
Office Visit Note   Patient: Charlene Thompson           Date of Birth: 03-02-1951           MRN: [id number] Visit Date: 01/13/2019              Requested by: Kathyrn Drown, MD [address] [place],  [address] PCP: Kathyrn Drown, MD  Chief Complaint  Patient presents with  . Right Knee - Follow-up      HPI: Patient is a 68 year old woman status post right knee arthroscopy with recurrent effusion.  She states her knee feels tight and stiff denies any pain with range of motion.  Previous aspiration resolved her symptoms for 24 hours.  Assessment & Plan: Visit Diagnoses:  1. S/P right knee arthroscopy     Plan: Knee was aspirated again today there was good clear synovial fluid continue increase her activities as tolerated.  Follow-Up Instructions: Return in about 3 weeks (around 02/03/2019).   Ortho Exam  Patient is alert, oriented, no adenopathy, well-dressed, normal affect, normal respiratory effort. Examination patient has an effusion of the right knee there is no redness no cellulitis no signs of infection 30 cc of clear synovial fluid was aspirated.  Imaging: No results found. No images are attached to the encounter.  Labs: Lab Results  Component Value Date   HGBA1C 6.0 (H) 09/30/2017   HGBA1C 5.9 (H) 09/18/2016   HGBA1C 5.8 03/04/2015     Lab Results  Component Value Date   ALBUMIN 4.4 09/30/2017   ALBUMIN 4.5 09/18/2016    Body mass index is 33.64 kg/m.  Orders:  No orders of the defined types were placed in this encounter.  No orders of the defined types were placed in this encounter.    Procedures: Large Joint Inj: R knee on 01/13/2019 3:48 PM Indications: pain and diagnostic evaluation Details: 22 G 1.5 in needle, superolateral approach  Arthrogram: No  Medications: 5 mL lidocaine 1 %; 40 mg methylPREDNISolone acetate 40 MG/ML Aspirate: 30 mL clear Outcome: tolerated well, no immediate complications Procedure, treatment  alternatives, risks and benefits explained, specific risks discussed. Consent was given by the patient. Immediately prior to procedure a time out was called to verify the correct patient, procedure, equipment, support staff and site/side marked as required. Patient was prepped and draped in the usual sterile fashion.      Clinical Data: No additional findings.  ROS:  All other systems negative, except as noted in the HPI. Review of Systems  Objective: Vital Signs: Ht 5\' 6"  (1.676 m)   Wt 208 lb 6.4 oz (94.5 kg)   BMI 33.64 kg/m   Specialty Comments:  No specialty comments available.  PMFS History: Patient Active Problem List   Diagnosis Date Noted  . Osteopenia 03/10/2015  . Prediabetes 03/06/2015  . Family history of colon cancer 09/03/2014  . Hyperlipidemia 06/07/2014  . Hyperglycemia 06/07/2014  . Hypertension    Past Medical History:  Diagnosis Date  . Basal cell carcinoma   . Hypertension     Family History  Problem Relation Age of Onset  . Diabetes Mother   . Heart disease Mother   . Diabetes Sister        half-sister  . Diabetes Brother        half-brother  . Heart disease Brother   . Kidney disease Sister        half-sister    Past Surgical History:  Procedure Laterality  Date  . BASAL CELL CARCINOMA EXCISION     nose  . CARPAL TUNNEL RELEASE Right    Social History   Occupational History  . Occupation: self employed  Tobacco Use  . Smoking status: Never Smoker  . Smokeless tobacco: Never Used  Substance and Sexual Activity  . Alcohol use: Yes    Alcohol/week: 0.0 standard drinks    Comment: occasional  . Drug use: No  . Sexual activity: Yes    Partners: Male    Birth control/protection: Post-menopausal

## 2019-02-04 ENCOUNTER — Ambulatory Visit (INDEPENDENT_AMBULATORY_CARE_PROVIDER_SITE_OTHER): Payer: Medicare Other | Admitting: Orthopedic Surgery

## 2019-02-04 ENCOUNTER — Telehealth (INDEPENDENT_AMBULATORY_CARE_PROVIDER_SITE_OTHER): Payer: Self-pay

## 2019-02-04 NOTE — Telephone Encounter (Signed)
Called pt to ask prescreen COVID-19 questions. LM ON Vm to call the office back. Has an appt tomorrow at 12:45

## 2019-02-05 ENCOUNTER — Telehealth (INDEPENDENT_AMBULATORY_CARE_PROVIDER_SITE_OTHER): Payer: Self-pay

## 2019-02-05 ENCOUNTER — Ambulatory Visit (INDEPENDENT_AMBULATORY_CARE_PROVIDER_SITE_OTHER): Payer: Medicare Other | Admitting: Orthopedic Surgery

## 2019-02-05 ENCOUNTER — Encounter (INDEPENDENT_AMBULATORY_CARE_PROVIDER_SITE_OTHER): Payer: Self-pay | Admitting: Orthopedic Surgery

## 2019-02-05 ENCOUNTER — Other Ambulatory Visit: Payer: Self-pay

## 2019-02-05 VITALS — Ht 66.0 in | Wt 208.4 lb

## 2019-02-05 DIAGNOSIS — M25461 Effusion, right knee: Secondary | ICD-10-CM

## 2019-02-05 MED ORDER — LIDOCAINE HCL 1 % IJ SOLN
5.0000 mL | INTRAMUSCULAR | Status: AC | PRN
  Administered 2019-02-05: 5 mL

## 2019-02-05 MED ORDER — METHYLPREDNISOLONE ACETATE 40 MG/ML IJ SUSP
40.0000 mg | INTRAMUSCULAR | Status: AC | PRN
  Administered 2019-02-05: 40 mg via INTRA_ARTICULAR

## 2019-02-05 NOTE — Telephone Encounter (Signed)
Pt called back and answered all COVID-19 questions NO. Pt has an appt today.

## 2019-02-05 NOTE — Progress Notes (Signed)
Office Visit Note   Patient: Charlene Thompson           Date of Birth: [birth year]           MRN: [id number] Visit Date: 02/05/2019              Requested by: Kathyrn Drown, MD [address] [place], [place] [address] PCP: Kathyrn Drown, MD  Chief Complaint  Patient presents with  . Right Knee - Routine Post Op    S/p right knee arthroscopy      HPI: Patient is a 68 year old woman status post right knee arthroscopy approximately 3 weeks ago who states she has recurrent swelling.  She states the previous aspirations have helped.  She states that her pain is about a 5 out of 10.  Assessment & Plan: Visit Diagnoses:  1. Effusion, right knee     Plan: Knee was aspirated no signs of infection follow-up in 2 weeks she will increase her activities as tolerated.  Follow-Up Instructions: Return in about 2 weeks (around 02/19/2019).   Ortho Exam  Patient is alert, oriented, no adenopathy, well-dressed, normal affect, normal respiratory effort. Examination of the right knee she has an increased effusion decreased range of motion no redness or cellulitis no signs of infection.  Knee was aspirated of 50 cc of clear synovial fluid and injected with steroid.  Imaging: No results found. No images are attached to the encounter.  Labs: Lab Results  Component Value Date   HGBA1C 6.0 (H) 09/30/2017   HGBA1C 5.9 (H) 09/18/2016   HGBA1C 5.8 03/04/2015     Lab Results  Component Value Date   ALBUMIN 4.4 09/30/2017   ALBUMIN 4.5 09/18/2016    Body mass index is 33.64 kg/m.  Orders:  Orders Placed This Encounter  Procedures  . Large Joint Inj   No orders of the defined types were placed in this encounter.    Procedures: Large Joint Inj: R knee on 02/05/2019 2:59 PM Indications: pain and diagnostic evaluation Details: 22 G 1.5 in needle, superolateral approach  Arthrogram: No  Medications: 5 mL lidocaine 1 %; 40 mg methylPREDNISolone acetate 40 MG/ML Aspirate:  50 mL clear Outcome: tolerated well, no immediate complications Procedure, treatment alternatives, risks and benefits explained, specific risks discussed. Consent was given by the patient. Immediately prior to procedure a time out was called to verify the correct patient, procedure, equipment, support staff and site/side marked as required. Patient was prepped and draped in the usual sterile fashion.      Clinical Data: No additional findings.  ROS:  All other systems negative, except as noted in the HPI. Review of Systems  Objective: Vital Signs: Ht 5\' 6"  (1.676 m)   Wt 208 lb 6.4 oz (94.5 kg)   BMI 33.64 kg/m   Specialty Comments:  No specialty comments available.  PMFS History: Patient Active Problem List   Diagnosis Date Noted  . Osteopenia 03/10/2015  . Prediabetes 03/06/2015  . Family history of colon cancer 09/03/2014  . Hyperlipidemia 06/07/2014  . Hyperglycemia 06/07/2014  . Hypertension    Past Medical History:  Diagnosis Date  . Basal cell carcinoma   . Hypertension     Family History  Problem Relation Age of Onset  . Diabetes Mother   . Heart disease Mother   . Diabetes Sister        half-sister  . Diabetes Brother        half-brother  . Heart disease Brother   .  Kidney disease Sister        half-sister    Past Surgical History:  Procedure Laterality Date  . BASAL CELL CARCINOMA EXCISION     nose  . CARPAL TUNNEL RELEASE Right    Social History   Occupational History  . Occupation: self employed  Tobacco Use  . Smoking status: Never Smoker  . Smokeless tobacco: Never Used  Substance and Sexual Activity  . Alcohol use: Yes    Alcohol/week: 0.0 standard drinks    Comment: occasional  . Drug use: No  . Sexual activity: Yes    Partners: Male    Birth control/protection: Post-menopausal

## 2019-02-19 ENCOUNTER — Ambulatory Visit (INDEPENDENT_AMBULATORY_CARE_PROVIDER_SITE_OTHER): Payer: Medicare Other | Admitting: Orthopedic Surgery

## 2019-02-24 ENCOUNTER — Encounter (INDEPENDENT_AMBULATORY_CARE_PROVIDER_SITE_OTHER): Payer: Self-pay | Admitting: Orthopedic Surgery

## 2019-02-24 ENCOUNTER — Other Ambulatory Visit: Payer: Self-pay

## 2019-02-24 ENCOUNTER — Ambulatory Visit (INDEPENDENT_AMBULATORY_CARE_PROVIDER_SITE_OTHER): Payer: Medicare Other | Admitting: Orthopedic Surgery

## 2019-02-24 VITALS — Ht 66.0 in | Wt 208.4 lb

## 2019-02-24 DIAGNOSIS — M25461 Effusion, right knee: Secondary | ICD-10-CM | POA: Diagnosis not present

## 2019-02-24 DIAGNOSIS — Z9889 Other specified postprocedural states: Secondary | ICD-10-CM

## 2019-02-24 DIAGNOSIS — S83281S Other tear of lateral meniscus, current injury, right knee, sequela: Secondary | ICD-10-CM

## 2019-02-24 MED ORDER — LIDOCAINE HCL 1 % IJ SOLN
1.0000 mL | INTRAMUSCULAR | Status: AC | PRN
  Administered 2019-02-24: 1 mL

## 2019-02-24 NOTE — Progress Notes (Signed)
Office Visit Note   Patient: Charlene Thompson           Date of Birth: 07/23/1951           MRN: [id number] Visit Date: 02/24/2019              Requested by: Kathyrn Drown, MD [address] [place], [place] [address] PCP: Kathyrn Drown, MD  Chief Complaint  Patient presents with  . Right Knee - Routine Post Op    11/25/18 right knee scope & deb S/p aspiration 02/15/19      HPI: Patient is a 68 year old woman who is seen for postoperative follow-up following right knee arthroscopic debridement back in January 2020.  She developed recurrent effusion and underwent aspiration earlier this month with steroid injection at that time and presents again with recurrent effusion over the right knee today.  She reports that the knee does not necessarily hurt but definitely feels tight.  She does notice some occasional aching over the knee.  Assessment & Plan: Visit Diagnoses:  1. Effusion, right knee   2. S/P right knee arthroscopy   3. Acute lateral meniscus tear of right knee, sequela     Plan: After informed consent, the superior lateral aspect of the knee was anesthetized with 1% lidocaine x1 cc and then the right knee was aspirated with an 18-gauge needle for approximately 45 cc of clear serous yellow fluid.  The patient tolerated this well.  She will follow-up in 2 weeks.  Follow-Up Instructions: Return in about 2 weeks (around 03/10/2019).   Ortho Exam  Patient is alert, oriented, no adenopathy, well-dressed, normal affect, normal respiratory effort. The right knee has a moderate effusion.  There is no erythema no increased warmth no signs of infection or cellulitis.  The patient has good strength and full knee extension and good flexion but feels very tight on end flexion.  The right knee was aspirated as noted above for clear serous fluid under sterile techniques and the patient tolerated this well.  Imaging: No results found. No images are attached to the encounter.   Labs: Lab Results  Component Value Date   HGBA1C 6.0 (H) 09/30/2017   HGBA1C 5.9 (H) 09/18/2016   HGBA1C 5.8 03/04/2015     Lab Results  Component Value Date   ALBUMIN 4.4 09/30/2017   ALBUMIN 4.5 09/18/2016    Body mass index is 33.64 kg/m.  Orders:  Orders Placed This Encounter  Procedures  . Large Joint Inj   No orders of the defined types were placed in this encounter.    Procedures: Large Joint Inj on 02/24/2019 10:44 AM Indications: joint swelling Details: 18 G 1.5 in needle, superolateral approach Medications: 1 mL lidocaine 1 % Aspirate: 45 mL serous and yellow Consent was given by the patient. Immediately prior to procedure a time out was called to verify the correct patient, procedure, equipment, support staff and site/side marked as required. Patient was prepped and draped in the usual sterile fashion.      Clinical Data: No additional findings.  ROS:  All other systems negative, except as noted in the HPI. Review of Systems  Objective: Vital Signs: Ht 5\' 6"  (1.676 m)   Wt 208 lb 6.4 oz (94.5 kg)   BMI 33.64 kg/m   Specialty Comments:  No specialty comments available.  PMFS History: Patient Active Problem List   Diagnosis Date Noted  . Osteopenia 03/10/2015  . Prediabetes 03/06/2015  . Family history of  colon cancer 09/03/2014  . Hyperlipidemia 06/07/2014  . Hyperglycemia 06/07/2014  . Hypertension    Past Medical History:  Diagnosis Date  . Basal cell carcinoma   . Hypertension     Family History  Problem Relation Age of Onset  . Diabetes Mother   . Heart disease Mother   . Diabetes Sister        half-sister  . Diabetes Brother        half-brother  . Heart disease Brother   . Kidney disease Sister        half-sister    Past Surgical History:  Procedure Laterality Date  . BASAL CELL CARCINOMA EXCISION     nose  . CARPAL TUNNEL RELEASE Right    Social History   Occupational History  . Occupation: self employed   Tobacco Use  . Smoking status: Never Smoker  . Smokeless tobacco: Never Used  Substance and Sexual Activity  . Alcohol use: Yes    Alcohol/week: 0.0 standard drinks    Comment: occasional  . Drug use: No  . Sexual activity: Yes    Partners: Male    Birth control/protection: Post-menopausal

## 2019-03-10 ENCOUNTER — Other Ambulatory Visit: Payer: Self-pay

## 2019-03-10 ENCOUNTER — Encounter: Payer: Self-pay | Admitting: Orthopedic Surgery

## 2019-03-10 ENCOUNTER — Ambulatory Visit (INDEPENDENT_AMBULATORY_CARE_PROVIDER_SITE_OTHER): Payer: Medicare Other | Admitting: Physician Assistant

## 2019-03-10 VITALS — Ht 66.0 in | Wt 208.4 lb

## 2019-03-10 DIAGNOSIS — S83281S Other tear of lateral meniscus, current injury, right knee, sequela: Secondary | ICD-10-CM | POA: Diagnosis not present

## 2019-03-10 DIAGNOSIS — M25461 Effusion, right knee: Secondary | ICD-10-CM

## 2019-03-10 DIAGNOSIS — Z9889 Other specified postprocedural states: Secondary | ICD-10-CM | POA: Diagnosis not present

## 2019-03-10 NOTE — Progress Notes (Signed)
Office Visit Note   Patient: Charlene Thompson           Date of Birth: October 30, 1950           MRN: [id number] Visit Date: 03/10/2019              Requested by: Kathyrn Drown, MD [address] [place], [place] [address] PCP: Kathyrn Drown, MD  Chief Complaint  Patient presents with  . Right Knee - Follow-up      HPI: The patient is a 68 year old woman who is seen status post right knee arthroscopy in January 2020.  She has developed recurrent effusion and presents with recurrent effusion and pain over the right knee today.  Assessment & Plan: Visit Diagnoses:  1. Effusion, right knee   2. S/P right knee arthroscopy   3. Acute lateral meniscus tear of right knee, sequela     Plan: After informed consent the patient underwent aspiration of the right knee for 43 cc of clear yellow serous fluid and then injection with lidocaine and Depo-Medrol under sterile techniques and she tolerated this procedure well.  She will follow-up in 2 weeks.  Follow-Up Instructions: Return in about 2 weeks (around 03/24/2019).   Ortho Exam  Patient is alert, oriented, no adenopathy, well-dressed, normal affect, normal respiratory effort. Right knee has moderate effusion.  There is no erythema or increased warmth no signs of cellulitis or infection.  The right knee was aspirated under sterile techniques as noted for 43 cc of serous appearing fluid without signs of infection.  The knee was then also injected with steroid.  Imaging: No results found. No images are attached to the encounter.  Labs: Lab Results  Component Value Date   HGBA1C 6.0 (H) 09/30/2017   HGBA1C 5.9 (H) 09/18/2016   HGBA1C 5.8 03/04/2015     Lab Results  Component Value Date   ALBUMIN 4.4 09/30/2017   ALBUMIN 4.5 09/18/2016    Body mass index is 33.64 kg/m.  Orders:  Orders Placed This Encounter  Procedures  . Large Joint Inj   No orders of the defined types were placed in this encounter.    Procedures: Large Joint Inj: R knee on 03/10/2019 12:39 PM Indications: pain and diagnostic evaluation Details: 22 G 1.5 in needle, superolateral approach  Arthrogram: No  Medications: 40 mg methylPREDNISolone acetate 40 MG/ML; 1 mL lidocaine 1 %; 5 mL lidocaine 1 % Aspirate: 43 mL serous, clear and yellow Outcome: tolerated well, no immediate complications  Aspirated for 43 cc of clear serous fluid and then injected with 5 ml lidocaine and 1 ml Depo medrol  Procedure, treatment alternatives, risks and benefits explained, specific risks discussed. Consent was given by the patient. Immediately prior to procedure a time out was called to verify the correct patient, procedure, equipment, support staff and site/side marked as required. Patient was prepped and draped in the usual sterile fashion.      Clinical Data: No additional findings.  ROS:  All other systems negative, except as noted in the HPI. Review of Systems  Objective: Vital Signs: Ht 5\' 6"  (1.676 m)   Wt 208 lb 6.4 oz (94.5 kg)   BMI 33.64 kg/m   Specialty Comments:  No specialty comments available.  PMFS History: Patient Active Problem List   Diagnosis Date Noted  . Osteopenia 03/10/2015  . Prediabetes 03/06/2015  . Family history of colon cancer 09/03/2014  . Hyperlipidemia 06/07/2014  . Hyperglycemia 06/07/2014  . Hypertension  Past Medical History:  Diagnosis Date  . Basal cell carcinoma   . Hypertension     Family History  Problem Relation Age of Onset  . Diabetes Mother   . Heart disease Mother   . Diabetes Sister        half-sister  . Diabetes Brother        half-brother  . Heart disease Brother   . Kidney disease Sister        half-sister    Past Surgical History:  Procedure Laterality Date  . BASAL CELL CARCINOMA EXCISION     nose  . CARPAL TUNNEL RELEASE Right    Social History   Occupational History  . Occupation: self employed  Tobacco Use  . Smoking status: Never Smoker  .  Smokeless tobacco: Never Used  Substance and Sexual Activity  . Alcohol use: Yes    Alcohol/week: 0.0 standard drinks    Comment: occasional  . Drug use: No  . Sexual activity: Yes    Partners: Male    Birth control/protection: Post-menopausal

## 2019-03-11 MED ORDER — METHYLPREDNISOLONE ACETATE 40 MG/ML IJ SUSP
40.0000 mg | INTRAMUSCULAR | Status: AC | PRN
  Administered 2019-03-10: 40 mg via INTRA_ARTICULAR

## 2019-03-11 MED ORDER — LIDOCAINE HCL 1 % IJ SOLN
1.0000 mL | INTRAMUSCULAR | Status: AC | PRN
  Administered 2019-03-10: 13:00:00 1 mL

## 2019-03-11 MED ORDER — LIDOCAINE HCL 1 % IJ SOLN
5.0000 mL | INTRAMUSCULAR | Status: AC | PRN
  Administered 2019-03-10: 5 mL

## 2019-03-31 ENCOUNTER — Other Ambulatory Visit: Payer: Self-pay

## 2019-03-31 ENCOUNTER — Encounter: Payer: Self-pay | Admitting: Orthopedic Surgery

## 2019-03-31 ENCOUNTER — Ambulatory Visit (INDEPENDENT_AMBULATORY_CARE_PROVIDER_SITE_OTHER): Payer: Medicare Other | Admitting: Orthopedic Surgery

## 2019-03-31 VITALS — Ht 66.0 in | Wt 208.4 lb

## 2019-03-31 DIAGNOSIS — M25461 Effusion, right knee: Secondary | ICD-10-CM | POA: Diagnosis not present

## 2019-03-31 NOTE — Progress Notes (Signed)
Office Visit Note   Patient: Charlene Thompson           Date of Birth: June 01, 1951           MRN: [id number] Visit Date: 03/31/2019              Requested by: Kathyrn Drown, MD [address] [place], [place] [address] PCP: Kathyrn Drown, MD  Chief Complaint  Patient presents with  . Right Knee - Follow-up      HPI: The patient is a 68 year old woman who is seen status post right knee arthroscopy in January 2020.  She has developed recurrent effusions. She presents today with recurrent effusion and pain over the right knee.  Some loss of range of motion and mild muscular discomfort around the knee.  Her knee continues to be swollen somewhat less so.  She has not been taking any anti-inflammatories.  Assessment & Plan: Visit Diagnoses:  1. Effusion, right knee     Plan: After informed consent the patient underwent aspiration of the right knee for 32 cc of clear yellow serous fluid.  She tolerated this well.  She will follow-up in the office in 3 to 4 weeks as needed.  Discussed that she will take Aleve twice daily for 2 weeks.   Now Follow-Up Instructions: Return in about 4 weeks (around 04/28/2019), or if symptoms worsen or fail to improve.   Right Knee Exam   Muscle Strength  The patient has normal right knee strength.  Tenderness  The patient is experiencing tenderness in the lateral joint line and medial joint line.  Range of Motion  The patient has normal right knee ROM.  Tests  Varus: negative Valgus: negative  Other  Erythema: absent Swelling: mild      Patient is alert, oriented, no adenopathy, well-dressed, normal affect, normal respiratory effort.   Imaging: No results found. No images are attached to the encounter.  Labs: Lab Results  Component Value Date   HGBA1C 6.0 (H) 09/30/2017   HGBA1C 5.9 (H) 09/18/2016   HGBA1C 5.8 03/04/2015     Lab Results  Component Value Date   ALBUMIN 4.4 09/30/2017   ALBUMIN 4.5 09/18/2016     Body mass index is 33.64 kg/m.  Orders:  No orders of the defined types were placed in this encounter.  No orders of the defined types were placed in this encounter.    Procedures: No procedures performed  Clinical Data: No additional findings.  ROS:  All other systems negative, except as noted in the HPI. Review of Systems  Constitutional: Negative for chills and fever.  Musculoskeletal: Positive for joint swelling and myalgias.    Objective: Vital Signs: Ht 5\' 6"  (1.676 m)   Wt 208 lb 6.4 oz (94.5 kg)   BMI 33.64 kg/m   Specialty Comments:  No specialty comments available.  PMFS History: Patient Active Problem List   Diagnosis Date Noted  . Osteopenia 03/10/2015  . Prediabetes 03/06/2015  . Family history of colon cancer 09/03/2014  . Hyperlipidemia 06/07/2014  . Hyperglycemia 06/07/2014  . Hypertension    Past Medical History:  Diagnosis Date  . Basal cell carcinoma   . Hypertension     Family History  Problem Relation Age of Onset  . Diabetes Mother   . Heart disease Mother   . Diabetes Sister        half-sister  . Diabetes Brother        half-brother  . Heart disease  Brother   . Kidney disease Sister        half-sister    Past Surgical History:  Procedure Laterality Date  . BASAL CELL CARCINOMA EXCISION     nose  . CARPAL TUNNEL RELEASE Right    Social History   Occupational History  . Occupation: self employed  Tobacco Use  . Smoking status: Never Smoker  . Smokeless tobacco: Never Used  Substance and Sexual Activity  . Alcohol use: Yes    Alcohol/week: 0.0 standard drinks    Comment: occasional  . Drug use: No  . Sexual activity: Yes    Partners: Male    Birth control/protection: Post-menopausal

## 2019-03-31 NOTE — Progress Notes (Deleted)
   Office Visit Note   Patient: Charlene Thompson           Date of Birth: [birth year]           MRN: [id number] Visit Date: 03/31/2019              Requested by: Kathyrn Drown, MD [address] [place], [place] [address] PCP: Kathyrn Drown, MD  Chief Complaint  Patient presents with  . Right Knee - Follow-up      HPI: ***  Assessment & Plan: Visit Diagnoses:  1. Effusion, right knee     Plan: ***  Follow-Up Instructions: Return in about 4 weeks (around 04/28/2019), or if symptoms worsen or fail to improve.   Ortho Exam  Patient is alert, oriented, no adenopathy, well-dressed, normal affect, normal respiratory effort. ***  Imaging: No results found. No images are attached to the encounter.  Labs: Lab Results  Component Value Date   HGBA1C 6.0 (H) 09/30/2017   HGBA1C 5.9 (H) 09/18/2016   HGBA1C 5.8 03/04/2015     Lab Results  Component Value Date   ALBUMIN 4.4 09/30/2017   ALBUMIN 4.5 09/18/2016    Body mass index is 33.64 kg/m.  Orders:  No orders of the defined types were placed in this encounter.  No orders of the defined types were placed in this encounter.    Procedures: Large Joint Inj: R knee on 03/31/2019 1:49 PM Indications: pain, diagnostic evaluation and joint swelling Details: 18 G 1.5 in needle, anteromedial approach Medications: 1 mL lidocaine 1 % Aspirate: 32 mL clear Outcome: tolerated well, no immediate complications Consent was given by the patient.      Clinical Data: No additional findings.  ROS:  All other systems negative, except as noted in the HPI. Review of Systems  Objective: Vital Signs: Ht 5\' 6"  (1.676 m)   Wt 208 lb 6.4 oz (94.5 kg)   BMI 33.64 kg/m   Specialty Comments:  No specialty comments available.  PMFS History: Patient Active Problem List   Diagnosis Date Noted  . Osteopenia 03/10/2015  . Prediabetes 03/06/2015  . Family history of colon cancer 09/03/2014  . Hyperlipidemia  06/07/2014  . Hyperglycemia 06/07/2014  . Hypertension    Past Medical History:  Diagnosis Date  . Basal cell carcinoma   . Hypertension     Family History  Problem Relation Age of Onset  . Diabetes Mother   . Heart disease Mother   . Diabetes Sister        half-sister  . Diabetes Brother        half-brother  . Heart disease Brother   . Kidney disease Sister        half-sister    Past Surgical History:  Procedure Laterality Date  . BASAL CELL CARCINOMA EXCISION     nose  . CARPAL TUNNEL RELEASE Right    Social History   Occupational History  . Occupation: self employed  Tobacco Use  . Smoking status: Never Smoker  . Smokeless tobacco: Never Used  Substance and Sexual Activity  . Alcohol use: Yes    Alcohol/week: 0.0 standard drinks    Comment: occasional  . Drug use: No  . Sexual activity: Yes    Partners: Male    Birth control/protection: Post-menopausal

## 2019-04-14 DIAGNOSIS — L821 Other seborrheic keratosis: Secondary | ICD-10-CM | POA: Diagnosis not present

## 2019-04-14 DIAGNOSIS — L570 Actinic keratosis: Secondary | ICD-10-CM | POA: Diagnosis not present

## 2019-05-29 ENCOUNTER — Other Ambulatory Visit: Payer: Self-pay

## 2019-06-26 ENCOUNTER — Ambulatory Visit (INDEPENDENT_AMBULATORY_CARE_PROVIDER_SITE_OTHER): Payer: Medicare Other | Admitting: Family Medicine

## 2019-06-26 ENCOUNTER — Other Ambulatory Visit: Payer: Self-pay

## 2019-06-26 DIAGNOSIS — I10 Essential (primary) hypertension: Secondary | ICD-10-CM

## 2019-06-26 DIAGNOSIS — E785 Hyperlipidemia, unspecified: Secondary | ICD-10-CM | POA: Diagnosis not present

## 2019-06-26 DIAGNOSIS — R7303 Prediabetes: Secondary | ICD-10-CM | POA: Diagnosis not present

## 2019-06-26 MED ORDER — LOSARTAN POTASSIUM-HCTZ 100-25 MG PO TABS: ORAL_TABLET | ORAL | 1 refills | Status: DC

## 2019-06-26 NOTE — Progress Notes (Signed)
   Subjective:    Patient ID: Charlene Thompson, female    DOB: 05-21-1951, 68 y.o.   MRN: [id number]  HPI    Review of Systems     Objective:   Physical Exam        Assessment & Plan:

## 2019-06-26 NOTE — Progress Notes (Signed)
   Subjective:    Patient ID: Charlene Thompson, female    DOB: 03/08/1951, 68 y.o.   MRN: [id number] Telephone visit virtual not possible Pt has not checked her bp.  Hypertension This is a chronic problem. Pertinent negatives include no chest pain or shortness of breath. Treatments tried: losartan potassium - hctz 100-25. There are no compliance problems (takes meds every day, eats healthy, states her work is her exercise).    Virtual Visit via Telephone Note  I connected with Charlene Thompson on 06/26/19 at  8:30 AM EDT by telephone and verified that I am speaking with the correct person using two identifiers.  Location: Patient: home  Provider: office   I discussed the limitations, risks, security and privacy concerns of performing an evaluation and management service by telephone and the availability of in person appointments. I also discussed with the patient that there may be a patient responsible charge related to this service. The patient expressed understanding and agreed to proceed. Patient relates that she has been taking her medication on a regular basis.  She states she does try to eat fairly healthy.  She has had blood pressure for a long time.  The best she knows her blood pressure is under good control.  She denies any headaches chest pain or shortness of breath.  She states her overall energy level is doing okay.  She does check her blood pressure occasionally he states it has been okay  History of Present Illness:    Observations/Objective:   Assessment and Plan:   Follow Up Instructions:    I discussed the assessment and treatment plan with the patient. The patient was provided an opportunity to ask questions and all were answered. The patient agreed with the plan and demonstrated an understanding of the instructions.   The patient was advised to call back or seek an in-person evaluation if the symptoms worsen or if the condition fails to improve as anticipated.  I  provided 15 minutes of non-face-to-face time during this encounter.       Review of Systems  Constitutional: Negative for activity change, appetite change and fatigue.  HENT: Negative for congestion and rhinorrhea.   Respiratory: Negative for cough and shortness of breath.   Cardiovascular: Negative for chest pain and leg swelling.  Gastrointestinal: Negative for abdominal pain and diarrhea.  Endocrine: Negative for polydipsia and polyphagia.  Skin: Negative for color change.  Neurological: Negative for dizziness and weakness.  Psychiatric/Behavioral: Negative for behavioral problems and confusion.       Objective:   Physical Exam  Today's visit was via telephone Physical exam was not possible for this visit       Assessment & Plan:  We did discuss blood pressure Discussed importance of low-salt diet Also importance of regular physical activity It is important for her to take her medicine on a regular basis We will send her paperwork on lab work Also I recommended a flu shot to be done either here or with the pharmacy  Mammogram recommended as well

## 2019-11-29 ENCOUNTER — Ambulatory Visit: Payer: Medicare Other

## 2019-12-10 ENCOUNTER — Ambulatory Visit: Payer: Medicare Other | Attending: Internal Medicine

## 2019-12-10 ENCOUNTER — Ambulatory Visit: Payer: Medicare Other

## 2019-12-10 DIAGNOSIS — Z23 Encounter for immunization: Secondary | ICD-10-CM | POA: Insufficient documentation

## 2019-12-10 NOTE — Progress Notes (Signed)
   Covid-19 Vaccination Clinic  Name:  Charlene Thompson    MRN: [id number] DOB: 09-27-1951  12/10/2019  [name] was observed post Covid-19 immunization for 15 minutes without incidence. She was provided with Vaccine Information Sheet and instruction to access the V-Safe system.   [name] was instructed to call 911 with any severe reactions post vaccine: Marland Kitchen Difficulty breathing  . Swelling of your face and throat  . A fast heartbeat  . A bad rash all over your body  . Dizziness and weakness    Immunizations Administered    Name Date Dose VIS Date Route   Pfizer COVID-19 Vaccine 12/10/2019  4:16 PM 0.3 mL 10/09/2019 Intramuscular   Manufacturer: [place]   Lot: [id number]   [place]: [id number]

## 2019-12-16 ENCOUNTER — Telehealth: Payer: Self-pay | Admitting: Family Medicine

## 2019-12-16 MED ORDER — LOSARTAN POTASSIUM-HCTZ 100-25 MG PO TABS: ORAL_TABLET | ORAL | 0 refills | Status: DC

## 2019-12-16 NOTE — Telephone Encounter (Signed)
Nurses please go ahead with 90-day supply of this medicine to her mail order

## 2019-12-16 NOTE — Telephone Encounter (Signed)
Patient said that she wants to use a mail order with her [facility] insurance.  She thinks it's [employer].  She said they were suppose to send [place] something Monday but I don't see anything anywhere.  I asked if she needed it to go to a local pharmacy since she only has about 5 pills left and the weather coming in but she wants it to go to mail order. Last visit was 06/26/19, next appt 01/06/20.  losartan-hydrochlorothiazide (HYZAAR) 100-25 MG

## 2019-12-16 NOTE — Telephone Encounter (Signed)
90 day supply sent to mail order pharmacy and pt is aware

## 2020-01-02 ENCOUNTER — Ambulatory Visit: Payer: Medicare Other | Attending: Internal Medicine

## 2020-01-02 DIAGNOSIS — Z23 Encounter for immunization: Secondary | ICD-10-CM | POA: Insufficient documentation

## 2020-01-02 NOTE — Progress Notes (Signed)
   Covid-19 Vaccination Clinic  Name:  Charlene Thompson    MRN: [id number] DOB: 1951-02-19  01/02/2020  Charlene Thompson was observed post Covid-19 immunization for 15 minutes without incident. She was provided with Vaccine Information Sheet and instruction to access the V-Safe system.   Charlene Thompson was instructed to call 911 with any severe reactions post vaccine: Marland Kitchen Difficulty breathing  . Swelling of face and throat  . A fast heartbeat  . A bad rash all over body  . Dizziness and weakness   Immunizations Administered    Name Date Dose VIS Date Route   Pfizer COVID-19 Vaccine 01/02/2020  1:52 PM 0.3 mL 10/09/2019 Intramuscular   Manufacturer: [place]   Lot: [id number]   [place]: [id number]

## 2020-01-06 ENCOUNTER — Ambulatory Visit (INDEPENDENT_AMBULATORY_CARE_PROVIDER_SITE_OTHER): Payer: Medicare Other | Admitting: Family Medicine

## 2020-01-06 ENCOUNTER — Other Ambulatory Visit: Payer: Self-pay

## 2020-01-06 DIAGNOSIS — I10 Essential (primary) hypertension: Secondary | ICD-10-CM

## 2020-01-06 MED ORDER — LOSARTAN POTASSIUM-HCTZ 100-25 MG PO TABS: ORAL_TABLET | ORAL | 1 refills | Status: DC

## 2020-01-06 NOTE — Progress Notes (Signed)
   Subjective:    Patient ID: Charlene Thompson, female    DOB: 06-Mar-1951, 69 y.o.   MRN: [id number]  Hypertension This is a chronic problem. The current episode started more than 1 year ago. Pertinent negatives include no chest pain or shortness of breath. Risk factors for coronary artery disease include post-menopausal state. Treatments tried: hyzaar. There are no compliance problems.    Patient reports no problems or concerns Patient states she takes her medicine regular basis denies any setbacks denies any side effects with the medicine relates compliance Virtual Visit via Video Note  I connected with Charlene Thompson on 01/06/20 at 11:00 AM EST by a video enabled telemedicine application and verified that I am speaking with the correct person using two identifiers.  Location: Patient: home Provider: office   I discussed the limitations of evaluation and management by telemedicine and the availability of in person appointments. The patient expressed understanding and agreed to proceed.  History of Present Illness:    Observations/Objective:   Assessment and Plan:   Follow Up Instructions:    I discussed the assessment and treatment plan with the patient. The patient was provided an opportunity to ask questions and all were answered. The patient agreed with the plan and demonstrated an understanding of the instructions.   The patient was advised to call back or seek an in-person evaluation if the symptoms worsen or if the condition fails to improve as anticipated.  I provided 18 minutes of non-face-to-face time during this encounter.   Fall Risk  01/06/2020 06/26/2019 05/29/2019 09/30/2017 03/06/2015  Falls in the past year? 0 0 0 No No  Comment - - Emmi Telephone Survey: data to providers prior to load - -  Number falls in past yr: - 0 - - -  Injury with Fall? - 0 - - -  Follow up Falls evaluation completed - - - -        Review of Systems  Constitutional: Negative for  activity change, appetite change and fatigue.  HENT: Negative for congestion and rhinorrhea.   Respiratory: Negative for cough and shortness of breath.   Cardiovascular: Negative for chest pain and leg swelling.  Gastrointestinal: Negative for abdominal pain and diarrhea.  Endocrine: Negative for polydipsia and polyphagia.  Skin: Negative for color change.  Neurological: Negative for dizziness and weakness.  Psychiatric/Behavioral: Negative for behavioral problems and confusion.       Objective:   Physical Exam   Today's visit was via telephone Physical exam was not possible for this visit      Assessment & Plan:  High blood pressure importance of taking medicine discussed refill sent in also very important for the patient to do lab work but she is hesitant to do any currently because of Covid we will send her a reminder in the summer for a wellness as well as lab work and follow-up on chronic health  Colonoscopy recommended patient defers currently

## 2020-04-13 ENCOUNTER — Encounter: Payer: Self-pay | Admitting: *Deleted

## 2020-04-22 ENCOUNTER — Ambulatory Visit: Payer: Medicare Other | Admitting: Physician Assistant

## 2020-05-12 ENCOUNTER — Ambulatory Visit: Payer: Medicare Other | Admitting: Physician Assistant

## 2020-06-24 ENCOUNTER — Encounter: Payer: Self-pay | Admitting: Nurse Practitioner

## 2020-06-24 ENCOUNTER — Ambulatory Visit (INDEPENDENT_AMBULATORY_CARE_PROVIDER_SITE_OTHER): Payer: Medicare Other | Admitting: Nurse Practitioner

## 2020-06-24 ENCOUNTER — Other Ambulatory Visit: Payer: Self-pay

## 2020-06-24 VITALS — BP 124/76 | HR 92 | Temp 96.9°F | Ht 65.0 in | Wt 202.0 lb

## 2020-06-24 DIAGNOSIS — I10 Essential (primary) hypertension: Secondary | ICD-10-CM

## 2020-06-24 DIAGNOSIS — R7303 Prediabetes: Secondary | ICD-10-CM

## 2020-06-24 DIAGNOSIS — Z01419 Encounter for gynecological examination (general) (routine) without abnormal findings: Secondary | ICD-10-CM

## 2020-06-24 DIAGNOSIS — Z23 Encounter for immunization: Secondary | ICD-10-CM | POA: Diagnosis not present

## 2020-06-24 DIAGNOSIS — M8580 Other specified disorders of bone density and structure, unspecified site: Secondary | ICD-10-CM

## 2020-06-24 DIAGNOSIS — T148XXA Other injury of unspecified body region, initial encounter: Secondary | ICD-10-CM

## 2020-06-24 DIAGNOSIS — E785 Hyperlipidemia, unspecified: Secondary | ICD-10-CM | POA: Diagnosis not present

## 2020-06-24 MED ORDER — LOSARTAN POTASSIUM-HCTZ 100-25 MG PO TABS: ORAL_TABLET | ORAL | 1 refills | Status: DC

## 2020-06-24 NOTE — Progress Notes (Signed)
   Subjective:    Patient ID: Edmonia Lynch, female    DOB: 1951/06/05, 69 y.o.   MRN: [id number]  HPI AWV- Annual Wellness Visit  The patient was seen for their annual wellness visit. The patient's past medical history, surgical history, and family history were reviewed. Pertinent vaccines were reviewed ( tetanus, pneumonia, shingles, flu) The patient's medication list was reviewed and updated.  The height and weight were entered.  BMI recorded in electronic record elsewhere  Cognitive screening was completed. Outcome of Mini - Cog: pass   Falls /depression screening electronically recorded within record elsewhere  Current tobacco usage:none (All patients who use tobacco were given written and verbal information on quitting)  Recent listing of emergency department/hospitalizations over the past year were reviewed.  current specialist the patient sees on a regular basis:    Medicare annual wellness visit patient questionnaire was reviewed.  A written screening schedule for the patient for the next 5-10 years was given. Appropriate discussion of followup regarding next visit was discussed.      Review of Systems     Objective:   Physical Exam        Assessment & Plan:

## 2020-06-24 NOTE — Progress Notes (Signed)
Subjective:    Patient ID: Charlene Thompson, female    DOB: [birth year], 69 y.o.   MRN: [id number]  HPI:  The patient was seen for their annual wellness visit.The patient's past medical history, surgical history, and family history were reviewed. Pertinent vaccines were reviewed. The patient has received COVID-19 Pfizer vaccine. She declines the shingles vaccine and the influenza vaccine at this visit. The patient's medication list was reviewed and updated. She describes her health as "good" but does report intermittent knee pain. She is followed by orthopedics and reports pain has not limited her daily activities. She has received routine eye and dental exams this year.She is scheduled for a dermatology visit this month for skin cancer screening.  Patient reports an active lifestyle with healthy eating habits. She denies smoking, alcohol consumption, or illicit drug use. She is postmenopausal and married with no recent change to sexual partner. Patient declines mammogram at this time and denies familial history of breast or ovarian cancer. Colonoscopy completed in 2019. Her last Dexa scan was in 2018, which did reveal osteopenia. She would like to repeat screening at this time. Patient noted with healing superficial abrasion to left posterior hand, last tetanus noted to be in 2001.    Review of Systems  Constitutional: Negative for activity change, appetite change, fatigue, fever and unexpected weight change.  HENT: Negative for congestion, sore throat and trouble swallowing.   Respiratory: Negative for cough, chest tightness, shortness of breath and wheezing.   Cardiovascular: Negative for chest pain and palpitations.  Gastrointestinal: Negative for abdominal distention, abdominal pain, blood in stool, constipation, diarrhea, nausea and vomiting.  Endocrine: Negative for cold intolerance and heat intolerance.  Genitourinary: Negative for difficulty urinating, dysuria, enuresis, frequency, genital  sores, hematuria, pelvic pain, vaginal bleeding, vaginal discharge and vaginal pain.  Musculoskeletal: Joint swelling: right knee   Skin: Positive for wound. Negative for rash.       Superficial healing abrasion back of left hand.   Allergic/Immunologic: Negative.   Neurological: Negative for dizziness, syncope, weakness, light-headedness and headaches.   Depression screen [facility] 2/9 06/24/2020 09/30/2017 03/28/2017 03/06/2015  Decreased Interest 0 0 0 0  Down, Depressed, Hopeless 0 0 0 0  PHQ - 2 Score 0 0 0 0        Objective:   Physical Exam Vitals reviewed.  Constitutional:      General: She is not in acute distress.    Appearance: Normal appearance. She is not ill-appearing.  Neck:     Thyroid: No thyroid mass, thyromegaly or thyroid tenderness.  Cardiovascular:     Rate and Rhythm: Normal rate and regular rhythm.     Heart sounds: Normal heart sounds. No murmur heard.   Pulmonary:     Effort: Pulmonary effort is normal. No respiratory distress.     Breath sounds: Normal breath sounds.  Chest:     Breasts: Breasts are symmetrical.        Right: Normal. No swelling, inverted nipple, mass, skin change or tenderness.        Left: Normal. No swelling, inverted nipple, mass, skin change or tenderness.  Abdominal:     General: Bowel sounds are normal.     Palpations: Abdomen is soft. There is no mass.     Tenderness: There is no abdominal tenderness. There is no guarding.  Genitourinary:    Comments: Defers GU exam; denies any problems.  Musculoskeletal:     Cervical back: Normal range of motion. No rigidity or tenderness.  Lymphadenopathy:     Cervical: No cervical adenopathy.     Upper Body:     Right upper body: No supraclavicular, axillary or pectoral adenopathy.     Left upper body: No supraclavicular, axillary or pectoral adenopathy.  Skin:    General: Skin is warm and dry.     Comments: Healing superficial abrasion to dorsal aspect left hand. No evidence of infection.     Neurological:     Mental Status: She is alert and oriented to person, place, and time.     Motor: No weakness.     Gait: Gait normal.  Psychiatric:        Mood and Affect: Mood normal.        Behavior: Behavior normal.        Thought Content: Thought content normal.        Judgment: Judgment normal.       Assessment & Plan:  Well woman exam - Plan: Hemoglobin A1c, Comprehensive Metabolic Panel (CMET), Lipid Profile  Essential hypertension - Plan: Hemoglobin A1c, Comprehensive Metabolic Panel (CMET), Lipid Profile  Hyperlipidemia, unspecified hyperlipidemia type - Plan: Hemoglobin A1c, Comprehensive Metabolic Panel (CMET), Lipid Profile  Prediabetes - Plan: Hemoglobin A1c, Comprehensive Metabolic Panel (CMET), Lipid Profile  Abrasion - Plan: Tdap vaccine greater than or equal to 7yo IM  Need for vaccination - Plan: Tdap vaccine greater than or equal to 7yo IM, Hemoglobin A1c, Comprehensive Metabolic Panel (CMET), Lipid Profile  Osteopenia, unspecified location - Plan: DG Bone Density  Discussed recommendations for mammograms. Defers at this time.   Educated patient on bone density screening. Dexa Scan scheduled. Educated patient on importance of weight bearing exercises, may consider OTC vitamin D supplement.  Continue efforts with healthy diet and weight loss efforts. Labs pending. Return in about 6 months (around 12/25/2020) for Hypertension.   Marland Kitchen

## 2020-06-25 ENCOUNTER — Encounter: Payer: Self-pay | Admitting: Nurse Practitioner

## 2020-06-30 ENCOUNTER — Ambulatory Visit (HOSPITAL_COMMUNITY)
Admission: RE | Admit: 2020-06-30 | Discharge: 2020-06-30 | Disposition: A | Discharge status: Home / Self Care | Payer: Medicare Other | Source: Ambulatory Visit | Attending: Nurse Practitioner | Admitting: Nurse Practitioner

## 2020-06-30 ENCOUNTER — Other Ambulatory Visit: Payer: Self-pay

## 2020-06-30 DIAGNOSIS — M85852 Other specified disorders of bone density and structure, left thigh: Secondary | ICD-10-CM | POA: Diagnosis not present

## 2020-06-30 DIAGNOSIS — Z780 Asymptomatic menopausal state: Secondary | ICD-10-CM | POA: Diagnosis not present

## 2020-06-30 DIAGNOSIS — M8580 Other specified disorders of bone density and structure, unspecified site: Secondary | ICD-10-CM | POA: Diagnosis not present

## 2020-06-30 DIAGNOSIS — Z13820 Encounter for screening for osteoporosis: Secondary | ICD-10-CM | POA: Insufficient documentation

## 2020-07-08 ENCOUNTER — Encounter: Payer: Self-pay | Admitting: Physician Assistant

## 2020-07-08 ENCOUNTER — Other Ambulatory Visit: Payer: Self-pay

## 2020-07-08 ENCOUNTER — Ambulatory Visit: Payer: Medicare Other | Admitting: Physician Assistant

## 2020-07-08 DIAGNOSIS — D229 Melanocytic nevi, unspecified: Secondary | ICD-10-CM

## 2020-07-08 DIAGNOSIS — L578 Other skin changes due to chronic exposure to nonionizing radiation: Secondary | ICD-10-CM

## 2020-07-08 DIAGNOSIS — D1800 Hemangioma unspecified site: Secondary | ICD-10-CM

## 2020-07-08 DIAGNOSIS — Z1283 Encounter for screening for malignant neoplasm of skin: Secondary | ICD-10-CM

## 2020-07-08 DIAGNOSIS — Z85828 Personal history of other malignant neoplasm of skin: Secondary | ICD-10-CM

## 2020-07-08 DIAGNOSIS — Z86018 Personal history of other benign neoplasm: Secondary | ICD-10-CM | POA: Diagnosis not present

## 2020-07-08 DIAGNOSIS — L814 Other melanin hyperpigmentation: Secondary | ICD-10-CM

## 2020-07-08 DIAGNOSIS — L821 Other seborrheic keratosis: Secondary | ICD-10-CM

## 2020-07-08 DIAGNOSIS — C44212 Basal cell carcinoma of skin of right ear and external auricular canal: Secondary | ICD-10-CM

## 2020-07-08 DIAGNOSIS — Z87898 Personal history of other specified conditions: Secondary | ICD-10-CM

## 2020-07-08 DIAGNOSIS — Z8589 Personal history of malignant neoplasm of other organs and systems: Secondary | ICD-10-CM

## 2020-07-08 NOTE — Progress Notes (Signed)
Follow-Up Visit   Subjective  Charlene Thompson is a 69 y.o. female who presents for the following: Annual Exam (skin check--concern behind right ear).   The following portions of the chart were reviewed this encounter and updated as appropriate:     Objective  Well appearing patient in no apparent distress; mood and affect are within normal limits.  A full examination was performed including scalp, head, eyes, ears, nose, lips, neck, chest, axillae, abdomen, back, buttocks, bilateral upper extremities, bilateral lower extremities, hands, feet, fingers, toes, fingernails, and toenails. All findings within normal limits unless otherwise noted below.  Objective  Right Axilla, Right Upper Cutaneous Lip: Scars clear  Objective  Chest - Medial (Center), Left Ear, Right Nasal Sidewall, Right Shoulder - Posterior: Scars clear  Objective  Left Upper Arm - Posterior: Scars clear  Objective  waist up: Full body skin exam. No atypical nevi.  Objective  Right Superior Helix: Hyperkeratotic scale with pink base      Assessment & Plan  History of atypical nevus (2) Right Axilla; Right Upper Cutaneous Lip  observe  History of basal cell carcinoma (BCC) of skin (4) Left Ear; Right Shoulder - Posterior; Chest - Medial [facility]); Right Nasal Sidewall  observe  History of squamous cell carcinoma Left Upper Arm - Posterior  observe  Screening for malignant neoplasm of skin waist up  Yearly skin exams  BCC (basal cell carcinoma), ear, right Right Superior Helix  Skin / nail biopsy Type of biopsy: tangential   Informed consent: discussed and consent obtained   Timeout: patient name, date of birth, surgical site, and procedure verified   Procedure prep:  Patient was prepped and draped in usual sterile fashion (Non sterile) Prep type:  Chlorhexidine Anesthesia: the lesion was anesthetized in a standard fashion   Anesthetic:  1% lidocaine w/ epinephrine 1-100,000 local  infiltration Instrument used: flexible razor blade   Outcome: patient tolerated procedure well   Post-procedure details: wound care instructions given    Destruction of lesion Complexity: simple   Destruction method: electrodesiccation and curettage   Informed consent: discussed and consent obtained   Timeout:  patient name, date of birth, surgical site, and procedure verified Anesthesia: the lesion was anesthetized in a standard fashion   Anesthetic:  1% lidocaine w/ epinephrine 1-100,000 local infiltration Curettage performed in three different directions: Yes   Electrodesiccation performed over the curetted area: Yes   Curettage cycles:  3 Margin per side (cm):  0.1 Final wound size (cm):  1 Hemostasis achieved with:  aluminum chloride Outcome: patient tolerated procedure well with no complications   Post-procedure details: wound care instructions given    Specimen 1 - Surgical pathology Differential Diagnosis: scc vs bcc Check Margins: No Txpbx-ED&C Lentigines - Scattered tan macules - Discussed due to sun exposure - Benign, observe - Call for any changes  Seborrheic Keratoses - Stuck-on, waxy, tan-brown papules and plaques  - Discussed benign etiology and prognosis. - Observe - Call for any changes  Melanocytic Nevi - Tan-brown and/or pink-flesh-colored symmetric macules and papules - Benign appearing on exam today - Observation - Call clinic for new or changing moles - Recommend daily use of broad spectrum spf 30+ sunscreen to sun-exposed areas.   Hemangiomas - Red papules - Discussed benign nature - Observe - Call for any changes  Actinic Damage - diffuse scaly erythematous macules with underlying dyspigmentation - Recommend daily broad spectrum sunscreen SPF 30+ to sun-exposed areas, reapply every 2 hours as needed.  -  Call for new or changing lesions.    I, Hadlie Gipson, PA-C, have reviewed all documentation's for this visit.  The documentation on  07/14/20 for the exam, diagnosis, procedures and orders are all accurate and complete.

## 2020-07-08 NOTE — Patient Instructions (Signed)

## 2020-12-22 ENCOUNTER — Other Ambulatory Visit: Payer: Self-pay | Admitting: Nurse Practitioner

## 2020-12-22 NOTE — Telephone Encounter (Signed)
90 day needs follow up may

## 2021-01-10 ENCOUNTER — Other Ambulatory Visit: Payer: Self-pay

## 2021-01-10 ENCOUNTER — Ambulatory Visit: Payer: Medicare Other | Admitting: Physician Assistant

## 2021-01-10 ENCOUNTER — Encounter: Payer: Self-pay | Admitting: Physician Assistant

## 2021-01-10 DIAGNOSIS — L820 Inflamed seborrheic keratosis: Secondary | ICD-10-CM

## 2021-01-10 DIAGNOSIS — L821 Other seborrheic keratosis: Secondary | ICD-10-CM

## 2021-01-10 DIAGNOSIS — Z85828 Personal history of other malignant neoplasm of skin: Secondary | ICD-10-CM | POA: Diagnosis not present

## 2021-01-10 DIAGNOSIS — L570 Actinic keratosis: Secondary | ICD-10-CM

## 2021-01-10 DIAGNOSIS — L720 Epidermal cyst: Secondary | ICD-10-CM

## 2021-01-10 DIAGNOSIS — Z86018 Personal history of other benign neoplasm: Secondary | ICD-10-CM

## 2021-01-10 DIAGNOSIS — D485 Neoplasm of uncertain behavior of skin: Secondary | ICD-10-CM

## 2021-01-10 DIAGNOSIS — L719 Rosacea, unspecified: Secondary | ICD-10-CM

## 2021-01-10 MED ORDER — MINOCYCLINE HCL 50 MG PO CAPS: 50.0000 mg | ORAL_CAPSULE | Freq: Two times a day (BID) | ORAL | 5 refills | Status: DC

## 2021-01-10 NOTE — Patient Instructions (Signed)

## 2021-01-16 ENCOUNTER — Encounter: Payer: Self-pay | Admitting: Physician Assistant

## 2021-01-16 NOTE — Progress Notes (Signed)
   Follow-Up Visit   Subjective  Charlene Thompson is a 70 y.o. female who presents for the following: Follow-up (Patient here today for 6 month follow up. Per patient she has a spot under right axilla that was removed in the past that she would like checked. ).   The following portions of the chart were reviewed this encounter and updated as appropriate:      Objective  Well appearing patient in no apparent distress; mood and affect are within normal limits.  All skin waist up examined.  Objective  Chest - Medial [facility]): Pink pearly papule     Objective  Left Forearm - Anterior: Erythematous patches with gritty scale.  Objective  Right Lower Back: Multiple Stuck-on, waxy, tan-brown papules and plaques. --Discussed benign etiology and prognosis.   Objective  Right Axilla: Solitary, smooth skin colored to translucent papule.   Objective  Right Axilla: Multiple white scar- clear  Objective  Left Upper Arm: White scar- clear  Objective  Neck - Anterior: Multiple white scar clear  Objective  Right Buccal Cheek: Centrifacial erythema with or without papules/pustules.    Assessment & Plan  Neoplasm of uncertain behavior of skin Chest - Medial (Center)  Skin / nail biopsy Type of biopsy: tangential   Informed consent: discussed and consent obtained   Timeout: patient name, date of birth, surgical site, and procedure verified   Procedure prep:  Patient was prepped and draped in usual sterile fashion (Non sterile) Prep type:  Chlorhexidine Anesthesia: the lesion was anesthetized in a standard fashion   Anesthetic:  1% lidocaine w/ epinephrine 1-100,000 local infiltration Instrument used: flexible razor blade   Outcome: patient tolerated procedure well   Post-procedure details: wound care instructions given    Specimen 1 - Surgical pathology Differential Diagnosis: bcc vs scc, isk Check Margins: No  AK (actinic keratosis) Left Forearm -  Anterior  Destruction of lesion - Left Forearm - Anterior Complexity: simple   Destruction method: cryotherapy   Informed consent: discussed and consent obtained   Timeout:  patient name, date of birth, surgical site, and procedure verified Lesion destroyed using liquid nitrogen: Yes   Cryotherapy cycles:  3 Outcome: patient tolerated procedure well with no complications    Seborrheic keratosis Right Lower Back  Okay to leave if stable  Milia Right Axilla  Patient will I& D  History of dysplastic nevus Right Axilla  Yearly skin check  History of squamous cell carcinoma of skin Left Upper Arm  Yearly skin check  History of basal cell cancer Neck - Anterior  Yearly skin check  Rosacea Right Buccal Cheek  minocycline (MINOCIN) 50 MG capsule - Right Buccal Cheek    I, Sharne Linders, PA-C, have reviewed all documentation's for this visit.  The documentation on 01/16/21 for the exam, diagnosis, procedures and orders are all accurate and complete.

## 2021-03-10 ENCOUNTER — Other Ambulatory Visit: Payer: Self-pay

## 2021-03-10 ENCOUNTER — Ambulatory Visit (INDEPENDENT_AMBULATORY_CARE_PROVIDER_SITE_OTHER): Payer: Medicare Other | Admitting: Nurse Practitioner

## 2021-03-10 VITALS — BP 111/71 | HR 84 | Temp 97.9°F | Ht 65.0 in | Wt 202.0 lb

## 2021-03-10 DIAGNOSIS — R739 Hyperglycemia, unspecified: Secondary | ICD-10-CM

## 2021-03-10 DIAGNOSIS — R7303 Prediabetes: Secondary | ICD-10-CM

## 2021-03-10 DIAGNOSIS — M8580 Other specified disorders of bone density and structure, unspecified site: Secondary | ICD-10-CM | POA: Diagnosis not present

## 2021-03-10 DIAGNOSIS — L900 Lichen sclerosus et atrophicus: Secondary | ICD-10-CM | POA: Diagnosis not present

## 2021-03-10 DIAGNOSIS — E785 Hyperlipidemia, unspecified: Secondary | ICD-10-CM

## 2021-03-10 DIAGNOSIS — I10 Essential (primary) hypertension: Secondary | ICD-10-CM

## 2021-03-10 MED ORDER — LOSARTAN POTASSIUM-HCTZ 100-25 MG PO TABS: ORAL_TABLET | ORAL | 5 refills | Status: DC

## 2021-03-10 MED ORDER — CLOBETASOL PROPIONATE 0.05 % EX CREA: TOPICAL_CREAM | CUTANEOUS | 0 refills | Status: DC

## 2021-03-10 NOTE — Progress Notes (Signed)
Subjective:    Patient ID: Charlene Thompson, female    DOB: 1950-10-31, 70 y.o.   MRN: [id number]  HPI  Patient presents for BP medication check.   Denies changes in vision, weakness, numbness, headaches, changes in speech, syncopal episodes, lightheadedness, chest pain, shortness of breath, chest tightness.  No edema. She does not check her BP at home.    Patient c/o rash on groin that has been present on and off for 'a couple of years'.  She has tried several ointments or creams and changed her soaps but nothing seems to completely get rid of it.  It burns/itches intermittently. Denies vaginal discharge.  Review of Systems     Objective:   Physical Exam Vitals and nursing note reviewed. Exam conducted with a chaperone present.  Constitutional:      General: She is not in acute distress. Cardiovascular:     Rate and Rhythm: Normal rate and regular rhythm.     Heart sounds: Normal heart sounds.     Comments: Carotids: no bruits or thrills.  Pulmonary:     Breath sounds: Normal breath sounds.  Genitourinary:    Comments: Several small areas of mild erythema, slightly raised patches on the inner labia bilaterally. No suspicious lesions for malignancy. No excoriation. No vaginal discharge.  Neurological:     Mental Status: She is alert.    Vitals:   03/10/21 1355  BP: 111/71  Pulse: 84  Temp: 97.9 F (36.6 C)  SpO2: 96%          Assessment & Plan:   Problem List Items Addressed This Visit      Musculoskeletal and Integument   Osteopenia   Relevant Orders   Comprehensive metabolic panel   Lipid panel   Hemoglobin A1c   TSH   CBC with Differential/Platelet     Other   Hyperglycemia   Relevant Orders   Comprehensive metabolic panel   Lipid panel   Hemoglobin A1c   TSH   CBC with Differential/Platelet   Hyperlipidemia   Relevant Medications   losartan-hydrochlorothiazide (HYZAAR) 100-25 MG tablet   Other Relevant Orders   Comprehensive metabolic panel    Lipid panel   Hemoglobin A1c   TSH   CBC with Differential/Platelet   Lichen sclerosus   Prediabetes   Relevant Orders   Comprehensive metabolic panel   Lipid panel   Hemoglobin A1c   TSH   CBC with Differential/Platelet    Other Visit Diagnoses    Essential hypertension    -  Primary   Relevant Medications   losartan-hydrochlorothiazide (HYZAAR) 100-25 MG tablet   Other Relevant Orders   Comprehensive metabolic panel   Lipid panel   Hemoglobin A1c   TSH   CBC with Differential/Platelet     Meds ordered this encounter  Medications  . clobetasol cream (TEMOVATE) 0.05 %    Sig: Apply small amount to affected area qHS x 4-6 weeks    Dispense:  30 g    Refill:  0    Order Specific Question:   Supervising Provider    Answer:   Sallee Lange A [[id number]]  . losartan-hydrochlorothiazide (HYZAAR) 100-25 MG tablet    Sig: Take 1 tablet PO QD for BP    Dispense:  30 tablet    Refill:  5    Order Specific Question:   Supervising Provider    Answer:   Sallee Lange A [[id number]]   Call back in 4-6 weeks if rash has not resolved.  May use periodically if needed. Otherwise reevaluate rash at her physical.  Continue healthy diet, activity and weight loss efforts.  Return for physical this summer with labs .

## 2021-03-10 NOTE — Patient Instructions (Addendum)
Lichen Sclerosus Lichen sclerosus is a skin problem. It can happen on any part of the body. It happens most often in the areas around the anus or the genitals. It can cause itching and discomfort. Treatment can help to control symptoms. It can also help prevent scarring that may lead to other problems. What are the causes? The cause of this condition is not known. It is not passed from one person to another. What increases the risk?  Being a woman who has reached menopause.  Being a man who was not circumcised.  Being a child who is about to reach puberty. What are the signs or symptoms? Symptoms of this condition include:  Thin, wrinkled, white areas on the skin.  Thickened white areas on the skin.  Red and swollen patches on the skin.  Tears or cracks in the skin.  Bruising.  Blood blisters.  Very bad itching.  Pain, itching, or burning when peeing (urinating). Children are also most likely to have trouble pooping (constipation). Some adults too can have trouble pooping.   How is this treated? This condition may be treated with:  Creams or ointments (topical steroids) that are put on the skin in the affected areas. This is the most common treatment.  Medicines that are taken by mouth.  Topical immunotherapy. This is putting creams and ointments on the affected area. The creams and ointments will make your immunity strong in order to fight the infection. This is needed if steroids have not helped.  Surgery. This is only needed if the condition is very bad and is causing problems such as scarring. Follow these instructions at home: Medicines  Take over-the-counter and prescription medicines only as told by your health care provider.  Use creams or ointments as told by your doctor. Skin care  Do not scratch the affected areas of skin.  If you are a woman, keep the vagina as clean and dry as you can.  Clean the affected area of skin gently with water only. Avoid  using rough towels or toilet paper.  Avoid products that irritate the skin. These include soap and scented lotions. Your doctor will tell you what creams to use to treat itching. General instructions  Keep all follow-up visits.  You may need to take these actions to prevent or treat trouble pooping: ? Drink enough fluid to keep your pee (urine) pale yellow. ? Take over-the-counter or prescription medicines. ? Eat foods that are high in fiber. These include beans, whole grains, and fresh fruits and vegetables. ? Limit foods that are high in fat and sugar. These include fried or sweet foods. Contact a doctor if:  Your redness, swelling, or pain gets worse.  You have fluid, blood, or pus coming from the area.  You have new red and swollen patches on your skin.  You have a fever.  You have pain during sex. Get help right away if:  You have very bad pain or burning in the affected areas, especially in the area around your vagina, penis, or anus. Summary  Lichen sclerosus is a skin problem. It can cause itching and discomfort.  This condition is usually treated with creams or ointments that are put on the skin in the affected areas.  Use medicines only as told by your doctor.  Do not scratch the affected areas of skin.  Keep all follow-up visits. This information is not intended to replace advice given to you by your health care provider. Make sure you discuss any questions   you have with your health care provider. Document Revised: 02/27/2020 Document Reviewed: 02/27/2020 Elsevier Patient Education  2021 Elsevier Inc.  

## 2021-03-11 ENCOUNTER — Encounter: Payer: Self-pay | Admitting: Nurse Practitioner

## 2021-03-13 DIAGNOSIS — I10 Essential (primary) hypertension: Secondary | ICD-10-CM | POA: Diagnosis not present

## 2021-03-13 DIAGNOSIS — R7303 Prediabetes: Secondary | ICD-10-CM | POA: Diagnosis not present

## 2021-03-13 DIAGNOSIS — E785 Hyperlipidemia, unspecified: Secondary | ICD-10-CM | POA: Diagnosis not present

## 2021-03-13 DIAGNOSIS — M8580 Other specified disorders of bone density and structure, unspecified site: Secondary | ICD-10-CM | POA: Diagnosis not present

## 2021-03-13 DIAGNOSIS — R739 Hyperglycemia, unspecified: Secondary | ICD-10-CM | POA: Diagnosis not present

## 2021-03-14 LAB — COMPREHENSIVE METABOLIC PANEL
ALT: 30 IU/L (ref 0–32)
AST: 25 IU/L (ref 0–40)
Albumin/Globulin Ratio: 1.8 (ref 1.2–2.2)
Albumin: 4.4 g/dL (ref 3.7–4.7)
Alkaline Phosphatase: 57 IU/L (ref 44–121)
BUN/Creatinine Ratio: 14 (ref 12–28)
BUN: 15 mg/dL (ref 8–27)
Bilirubin Total: 0.3 mg/dL (ref 0.0–1.2)
CO2: 22 mmol/L (ref 20–29)
Calcium: 10.2 mg/dL (ref 8.7–10.3)
Chloride: 102 mmol/L (ref 96–106)
Creatinine, Ser: 1.05 mg/dL — ABNORMAL HIGH (ref 0.57–1.00)
Globulin, Total: 2.4 g/dL (ref 1.5–4.5)
Glucose: 84 mg/dL (ref 65–99)
Potassium: 4 mmol/L (ref 3.5–5.2)
Sodium: 141 mmol/L (ref 134–144)
Total Protein: 6.8 g/dL (ref 6.0–8.5)
eGFR: 56 mL/min/{1.73_m2} — ABNORMAL LOW (ref >59)

## 2021-03-14 LAB — HEMOGLOBIN A1C
Est. average glucose Bld gHb Est-mCnc: 128 mg/dL
Hgb A1c MFr Bld: 6.1 % — ABNORMAL HIGH (ref 4.8–5.6)

## 2021-03-14 LAB — LIPID PANEL
Chol/HDL Ratio: 3.5 ratio (ref 0.0–4.4)
Cholesterol, Total: 208 mg/dL — ABNORMAL HIGH (ref 100–199)
HDL: 59 mg/dL (ref >39)
LDL Chol Calc (NIH): 131 mg/dL — ABNORMAL HIGH (ref 0–99)
Triglycerides: 99 mg/dL (ref 0–149)
VLDL Cholesterol Cal: 18 mg/dL (ref 5–40)

## 2021-03-14 LAB — TSH: TSH: 1.49 u[IU]/mL (ref 0.450–4.500)

## 2021-03-14 LAB — CBC WITH DIFFERENTIAL/PLATELET
Basophils Absolute: 0.1 10*3/uL (ref 0.0–0.2)
Basos: 1 %
EOS (ABSOLUTE): 0.2 10*3/uL (ref 0.0–0.4)
Eos: 4 %
Hematocrit: 43.2 % (ref 34.0–46.6)
Hemoglobin: 14.7 g/dL (ref 11.1–15.9)
Immature Grans (Abs): 0 10*3/uL (ref 0.0–0.1)
Immature Granulocytes: 0 %
Lymphocytes Absolute: 1.5 10*3/uL (ref 0.7–3.1)
Lymphs: 32 %
MCH: 30.7 pg (ref 26.6–33.0)
MCHC: 34 g/dL (ref 31.5–35.7)
MCV: 90 fL (ref 79–97)
Monocytes Absolute: 0.4 10*3/uL (ref 0.1–0.9)
Monocytes: 9 %
Neutrophils Absolute: 2.6 10*3/uL (ref 1.4–7.0)
Neutrophils: 54 %
Platelets: 194 10*3/uL (ref 150–450)
RBC: 4.79 x10E6/uL (ref 3.77–5.28)
RDW: 12.4 % (ref 11.7–15.4)
WBC: 4.8 10*3/uL (ref 3.4–10.8)

## 2021-07-13 ENCOUNTER — Other Ambulatory Visit: Payer: Self-pay

## 2021-07-13 ENCOUNTER — Ambulatory Visit: Payer: Medicare Other | Admitting: Physician Assistant

## 2021-07-13 ENCOUNTER — Encounter: Payer: Self-pay | Admitting: Physician Assistant

## 2021-07-13 DIAGNOSIS — Z86018 Personal history of other benign neoplasm: Secondary | ICD-10-CM

## 2021-07-13 DIAGNOSIS — D485 Neoplasm of uncertain behavior of skin: Secondary | ICD-10-CM

## 2021-07-13 DIAGNOSIS — Z85828 Personal history of other malignant neoplasm of skin: Secondary | ICD-10-CM | POA: Diagnosis not present

## 2021-07-13 DIAGNOSIS — L570 Actinic keratosis: Secondary | ICD-10-CM

## 2021-07-13 DIAGNOSIS — L720 Epidermal cyst: Secondary | ICD-10-CM | POA: Diagnosis not present

## 2021-07-13 DIAGNOSIS — Z1283 Encounter for screening for malignant neoplasm of skin: Secondary | ICD-10-CM | POA: Diagnosis not present

## 2021-07-13 NOTE — Progress Notes (Signed)
   Follow-Up Visit   Subjective  Charlene Thompson is a 70 y.o. female who presents for the following: Annual Exam (No new concerns other than dark spots on hands- no itch, no bleed.  Personal history of atypical nevi & non mole skin cancers. Denies any family history of skin cancers. ).   The following portions of the chart were reviewed this encounter and updated as appropriate:  Tobacco  Allergies  Meds  Problems  Med Hx  Surg Hx  Fam Hx      Objective  Well appearing patient in no apparent distress; mood and affect are within normal limits.  A full examination was performed including scalp, head, eyes, ears, nose, lips, neck, chest, axillae, abdomen, back, buttocks, bilateral upper extremities, bilateral lower extremities, hands, feet, fingers, toes, fingernails, and toenails. All findings within normal limits unless otherwise noted below.  left temple Erythematous patches with gritty scale.     Right Axilla Nodule    Assessment & Plan  AK (actinic keratosis) left temple  Destruction of lesion - left temple Complexity: simple   Destruction method: cryotherapy   Informed consent: discussed and consent obtained   Timeout:  patient name, date of birth, surgical site, and procedure verified Lesion destroyed using liquid nitrogen: Yes   Cryotherapy cycles:  3 Outcome: patient tolerated procedure well with no complications   Post-procedure details: wound care instructions given    Neoplasm of uncertain behavior of skin Right Axilla  Skin / nail biopsy Type of biopsy: punch   Informed consent: discussed and consent obtained   Procedure prep:  Patient was prepped and draped in usual sterile fashion (nonsterile) Prep type:  Chlorhexidine Anesthesia: the lesion was anesthetized in a standard fashion   Anesthetic:  1% lidocaine w/ epinephrine 1-100,000 local infiltration Punch size:  6 mm Suture size:  4-0 Suture type: nylon   Outcome: patient tolerated procedure well    Post-procedure details: wound care instructions given   Post-procedure details comment:  Nonsterile  Specimen 1 - Surgical pathology Differential Diagnosis: cyst  Check Margins: No  All scars clear.  I, Kadir Azucena, PA-C, have reviewed all documentation's for this visit.  The documentation on 07/13/21 for the exam, diagnosis, procedures and orders are all accurate and complete.

## 2021-07-13 NOTE — Patient Instructions (Signed)

## 2021-07-20 ENCOUNTER — Other Ambulatory Visit: Payer: Self-pay

## 2021-07-20 ENCOUNTER — Ambulatory Visit (INDEPENDENT_AMBULATORY_CARE_PROVIDER_SITE_OTHER): Payer: Medicare Other | Admitting: *Deleted

## 2021-07-20 DIAGNOSIS — Z4802 Encounter for removal of sutures: Secondary | ICD-10-CM

## 2021-07-20 NOTE — Progress Notes (Signed)
Here for NTS suture removal. No signs or symptoms of infection. Path to patient. 

## 2021-07-26 ENCOUNTER — Ambulatory Visit: Payer: Medicare Other | Admitting: Family Medicine

## 2021-07-26 NOTE — Patient Instructions (Signed)
Health Maintenance, Female Adopting a healthy lifestyle and getting preventive care are important in promoting health and wellness. Ask your health care provider about: The right schedule for you to have regular tests and exams. Things you can do on your own to prevent diseases and keep yourself healthy. What should I know about diet, weight, and exercise? Eat a healthy diet  Eat a diet that includes plenty of vegetables, fruits, low-fat dairy products, and lean protein. Do not eat a lot of foods that are high in solid fats, added sugars, or sodium. Maintain a healthy weight Body mass index (BMI) is used to identify weight problems. It estimates body fat based on height and weight. Your health care provider can help determine your BMI and help you achieve or maintain a healthy weight. Get regular exercise Get regular exercise. This is one of the most important things you can do for your health. Most adults should: Exercise for at least 150 minutes each week. The exercise should increase your heart rate and make you sweat (moderate-intensity exercise). Do strengthening exercises at least twice a week. This is in addition to the moderate-intensity exercise. Spend less time sitting. Even light physical activity can be beneficial. Watch cholesterol and blood lipids Have your blood tested for lipids and cholesterol at 72 years of age, then have this test every 5 years. Have your cholesterol levels checked more often if: Your lipid or cholesterol levels are high. You are older than 72 years of age. You are at high risk for heart disease. What should I know about cancer screening? Depending on your health history and family history, you may need to have cancer screening at various ages. This may include screening for: Breast cancer. Cervical cancer. Colorectal cancer. Skin cancer. Lung cancer. What should I know about heart disease, diabetes, and high blood pressure? Blood pressure and heart  disease High blood pressure causes heart disease and increases the risk of stroke. This is more likely to develop in people who have high blood pressure readings, are of African descent, or are overweight. Have your blood pressure checked: Every 3-5 years if you are 18-39 years of age. Every year if you are 40 years old or older. Diabetes Have regular diabetes screenings. This checks your fasting blood sugar level. Have the screening done: Once every three years after age 40 if you are at a normal weight and have a low risk for diabetes. More often and at a younger age if you are overweight or have a high risk for diabetes. What should I know about preventing infection? Hepatitis B If you have a higher risk for hepatitis B, you should be screened for this virus. Talk with your health care provider to find out if you are at risk for hepatitis B infection. Hepatitis C Testing is recommended for: Everyone born from 1945 through 1965. Anyone with known risk factors for hepatitis C. Sexually transmitted infections (STIs) Get screened for STIs, including gonorrhea and chlamydia, if: You are sexually active and are younger than 72 years of age. You are older than 72 years of age and your health care provider tells you that you are at risk for this type of infection. Your sexual activity has changed since you were last screened, and you are at increased risk for chlamydia or gonorrhea. Ask your health care provider if you are at risk. Ask your health care provider about whether you are at high risk for HIV. Your health care provider may recommend a prescription medicine   to help prevent HIV infection. If you choose to take medicine to prevent HIV, you should first get tested for HIV. You should then be tested every 3 months for as long as you are taking the medicine. Pregnancy If you are about to stop having your period (premenopausal) and you may become pregnant, seek counseling before you get  pregnant. Take 400 to 800 micrograms (mcg) of folic acid every day if you become pregnant. Ask for birth control (contraception) if you want to prevent pregnancy. Osteoporosis and menopause Osteoporosis is a disease in which the bones lose minerals and strength with aging. This can result in bone fractures. If you are 65 years old or older, or if you are at risk for osteoporosis and fractures, ask your health care provider if you should: Be screened for bone loss. Take a calcium or vitamin D supplement to lower your risk of fractures. Be given hormone replacement therapy (HRT) to treat symptoms of menopause. Follow these instructions at home: Lifestyle Do not use any products that contain nicotine or tobacco, such as cigarettes, e-cigarettes, and chewing tobacco. If you need help quitting, ask your health care provider. Do not use street drugs. Do not share needles. Ask your health care provider for help if you need support or information about quitting drugs. Alcohol use Do not drink alcohol if: Your health care provider tells you not to drink. You are pregnant, may be pregnant, or are planning to become pregnant. If you drink alcohol: Limit how much you use to 0-1 drink a day. Limit intake if you are breastfeeding. Be aware of how much alcohol is in your drink. In the [place] one drink equals one 12 oz bottle of beer (355 mL), one 5 oz glass of wine (148 mL), or one 1 oz glass of hard liquor (44 mL). General instructions Schedule regular health, dental, and eye exams. Stay current with your vaccines. Tell your health care provider if: You often feel depressed. You have ever been abused or do not feel safe at home. Summary Adopting a healthy lifestyle and getting preventive care are important in promoting health and wellness. Follow your health care provider's instructions about healthy diet, exercising, and getting tested or screened for diseases. Follow your health care provider's  instructions on monitoring your cholesterol and blood pressure. This information is not intended to replace advice given to you by your health care provider. Make sure you discuss any questions you have with your health care provider. Document Revised: 12/23/2020 Document Reviewed: 10/08/2018 Elsevier Patient Education  2022 Elsevier Inc.  

## 2021-07-26 NOTE — Progress Notes (Deleted)
Subjective:   Charlene Thompson is a 70 y.o. female who presents for Medicare Annual (Subsequent) preventive examination.  I connected with  Charlene Thompson on 07/26/21 by a telephone and verified that I am speaking with the correct person using two identifiers.  I discussed the limitations, risks, security and privacy concerns of performing an evaluation and management service by telephone and the availability of in person appointments. The patient expressed understanding and agreed to proceed  Location of patient: Home Location of Provider: telephone Persons participating in virtual visit: Charlene Thompson (patient), Mazey Mantell (RMA)     Review of Systems    N/A       Objective:    There were no vitals filed for this visit. There is no height or weight on file to calculate BMI.  No flowsheet data found.  Current Medications (verified) Outpatient Encounter Medications as of 07/26/2021  Medication Sig   clobetasol cream (TEMOVATE) 0.05 % Apply small amount to affected area qHS x 4-6 weeks   losartan-hydrochlorothiazide (HYZAAR) 100-25 MG tablet Take 1 tablet PO QD for BP   minocycline (MINOCIN) 50 MG capsule Take 1 capsule (50 mg total) by mouth 2 (two) times daily.   No facility-administered encounter medications on file as of 07/26/2021.    Allergies (verified) Valium [diazepam]   History: Past Medical History:  Diagnosis Date   Atypical nevus 09/06/2005   atypical neoplasm- right axilla (EXC)   Atypical nevus 05/16/2018   severe-right upper lip (WS)   Basal cell carcinoma 12/08/2008   with sclerosis-left ear rim (CX35FU)   Basal cell carcinoma of skin 06/13/2012   right side nose (MOHS)   Basal cell carcinoma of skin 11/21/2012   chest (txpbx)   Basal cell carcinoma of skin 05/07/2017   nodular-right post shoulder   Hypertension    Squamous cell carcinoma of skin 07/10/2013   left uper arm (CX35FU)   Past Surgical History:  Procedure Laterality Date    BASAL CELL CARCINOMA EXCISION     nose   CARPAL TUNNEL RELEASE Right    Family History  Problem Relation Age of Onset   Diabetes Mother    Heart disease Mother    Diabetes Sister        half-sister   Diabetes Brother        half-brother   Heart disease Brother    Kidney disease Sister        half-sister   Social History   Socioeconomic History   Marital status: Married    Spouse name: Not on file   Number of children: 2   Years of education: Not on file   Highest education level: Not on file  Occupational History   Occupation: self employed  Tobacco Use   Smoking status: Never   Smokeless tobacco: Never  Substance and Sexual Activity   Alcohol use: Yes    Alcohol/week: 0.0 standard drinks    Comment: occasional   Drug use: No   Sexual activity: Yes    Partners: Male    Birth control/protection: Post-menopausal  Other Topics Concern   Not on file  Social History Narrative   Not on file   Social Determinants of Health   Financial Resource Strain: Not on file  Food Insecurity: Not on file  Transportation Needs: Not on file  Physical Activity: Not on file  Stress: Not on file  Social Connections: Not on file    Tobacco Counseling Counseling given: Not Answered   Clinical  Intake:                 Diabetic?***         Activities of Daily Living No flowsheet data found.  Patient Care Team: Kathyrn Drown, MD as PCP - General (Family Medicine) Warren Danes, PA-C as Physician Assistant (Dermatology)  Indicate any recent Medical Services you may have received from other than [facility] providers in the past year (date may be approximate).     Assessment:   This is a routine wellness examination for [place].  Hearing/Vision screen No results found.  Dietary issues and exercise activities discussed:     Goals Addressed   None    Depression Screen PHQ 2/9 Scores 03/10/2021 06/24/2020 09/30/2017 03/28/2017 03/06/2015  PHQ - 2 Score 0 0 0 0  0    Fall Risk Fall Risk  03/10/2021 01/06/2020 06/26/2019 05/29/2019 09/30/2017  Falls in the past year? 0 0 0 0 No  Comment - - - Emmi Telephone Survey: data to providers prior to load -  Number falls in past yr: 0 - 0 - -  Injury with Fall? 0 - 0 - -  Follow up Falls evaluation completed Falls evaluation completed - - -    FALL RISK PREVENTION PERTAINING TO THE HOME:  Any stairs in or around the home? {YES/NO:21197} If so, are there any without handrails? {YES/NO:21197} Home free of loose throw rugs in walkways, pet beds, electrical cords, etc? {YES/NO:21197} Adequate lighting in your home to reduce risk of falls? {YES/NO:21197}  ASSISTIVE DEVICES UTILIZED TO PREVENT FALLS:  Life alert? {YES/NO:21197} Use of a cane, walker or w/c? {YES/NO:21197} Grab bars in the bathroom? {YES/NO:21197} Shower chair or bench in shower? {YES/NO:21197} Elevated toilet seat or a handicapped toilet? {YES/NO:21197}  TIMED UP AND GO:  Was the test performed? {YES/NO:21197}.  Length of time to ambulate 10 feet: *** sec.   {Appearance of ZOXW:9604540}  Cognitive Function:        Immunizations Immunization History  Administered Date(s) Administered   PFIZER(Purple Top)SARS-COV-2 Vaccination 12/10/2019, 01/02/2020   Pneumococcal Conjugate-13 09/30/2017   Pneumococcal Polysaccharide-23 03/04/2015   Td 02/20/2000   Tdap 06/24/2020    {TDAP status:2101805}  {Flu Vaccine status:2101806}  {Pneumococcal vaccine status:2101807}  {Covid-19 vaccine status:2101808}  Qualifies for Shingles Vaccine? {YES/NO:21197}  Zostavax completed {YES/NO:21197}  {Shingrix Completed?:2101804}  Screening Tests Health Maintenance  Topic Date Due   Zoster Vaccines- Shingrix (1 of 2) Never done   MAMMOGRAM  07/06/2016   COLONOSCOPY (Pts 45-49yrs Insurance coverage will need to be confirmed)  11/23/2019   COVID-19 Vaccine (3 - Pfizer risk series) 01/30/2020   INFLUENZA VACCINE  Never done   TETANUS/TDAP   06/24/2030   DEXA SCAN  Completed   Hepatitis C Screening  Completed   HPV VACCINES  Aged Out    Health Maintenance  Health Maintenance Due  Topic Date Due   Zoster Vaccines- Shingrix (1 of 2) Never done   MAMMOGRAM  07/06/2016   COLONOSCOPY (Pts 45-81yrs Insurance coverage will need to be confirmed)  11/23/2019   COVID-19 Vaccine (3 - Pfizer risk series) 01/30/2020   INFLUENZA VACCINE  Never done    {Colorectal cancer screening:2101809}  {Mammogram status:21018020}  {Bone Density status:21018021}  Lung Cancer Screening: (Low Dose CT Chest recommended if Age 50-80 years, 30 pack-year currently smoking OR have quit w/in 15years.) {DOES NOT does:27190::"does not"} qualify.   Lung Cancer Screening Referral: ***  Additional Screening:  Hepatitis C Screening: {DOES NOT does:27190::"does not"} qualify;  Completed ***  Vision Screening: Recommended annual ophthalmology exams for early detection of glaucoma and other disorders of the eye. Is the patient up to date with their annual eye exam?  {YES/NO:21197} Who is the provider or what is the name of the office in which the patient attends annual eye exams? *** If pt is not established with a provider, would they like to be referred to a provider to establish care? {YES/NO:21197}.   Dental Screening: Recommended annual dental exams for proper oral hygiene  Community Resource Referral / Chronic Care Management: CRR required this visit?  {YES/NO:21197}  CCM required this visit?  {YES/NO:21197}     Plan:     I have personally reviewed and noted the following in the patient's chart:   Medical and social history Use of alcohol, tobacco or illicit drugs  Current medications and supplements including opioid prescriptions.  Functional ability and status Nutritional status Physical activity Advanced directives List of other physicians Hospitalizations, surgeries, and ER visits in previous 12 months Vitals Screenings to  include cognitive, depression, and falls Referrals and appointments  In addition, I have reviewed and discussed with patient certain preventive protocols, quality metrics, and best practice recommendations. A written personalized care plan for preventive services as well as general preventive health recommendations were provided to patient.     [place], [place]   07/26/2021   Nurse Notes: non face to face 1 hr visit

## 2021-09-20 ENCOUNTER — Other Ambulatory Visit: Payer: Self-pay | Admitting: Nurse Practitioner

## 2021-10-17 ENCOUNTER — Telehealth: Payer: Self-pay | Admitting: Family Medicine

## 2021-10-17 NOTE — Telephone Encounter (Signed)
Left message for patient to call back and schedule Medicare Annual Wellness Visit (AWV) in office.   If unable to come into the office for AWV,  please offer to do virtually or by telephone.  Last AWV: 09/30/2017  Please schedule at anytime with [facility]-Nurse Health Advisor.  40 minute appointment  Any questions, please contact me at [phone]

## 2021-10-18 ENCOUNTER — Telehealth: Payer: Self-pay | Admitting: Family Medicine

## 2021-10-20 NOTE — Telephone Encounter (Signed)
Left message for patient to call back and schedule Medicare Annual Wellness Visit (AWV) in office.   If unable to come into the office for AWV,  please offer to do virtually or by telephone.  Last AWV: 09/30/2017  Please schedule at anytime with [facility].  40 minute appointment  Any questions, please contact me at [phone]

## 2021-11-15 ENCOUNTER — Other Ambulatory Visit: Payer: Self-pay

## 2021-11-15 ENCOUNTER — Ambulatory Visit: Payer: Medicare Other | Admitting: Physician Assistant

## 2021-11-15 DIAGNOSIS — Z85828 Personal history of other malignant neoplasm of skin: Secondary | ICD-10-CM

## 2021-11-15 DIAGNOSIS — Z1283 Encounter for screening for malignant neoplasm of skin: Secondary | ICD-10-CM | POA: Diagnosis not present

## 2021-11-15 DIAGNOSIS — L219 Seborrheic dermatitis, unspecified: Secondary | ICD-10-CM

## 2021-11-15 DIAGNOSIS — Z86018 Personal history of other benign neoplasm: Secondary | ICD-10-CM

## 2021-11-15 DIAGNOSIS — L719 Rosacea, unspecified: Secondary | ICD-10-CM | POA: Diagnosis not present

## 2021-11-15 DIAGNOSIS — L570 Actinic keratosis: Secondary | ICD-10-CM | POA: Diagnosis not present

## 2021-11-15 MED ORDER — CLOBETASOL PROPIONATE 0.05 % EX SOLN
1.0000 | Freq: Two times a day (BID) | CUTANEOUS | 8 refills | Status: DC
Start: 2021-11-15 — End: 2022-03-28

## 2021-11-15 MED ORDER — MINOCYCLINE HCL 50 MG PO CAPS: 50.0000 mg | ORAL_CAPSULE | Freq: Two times a day (BID) | ORAL | 5 refills | Status: DC

## 2021-11-21 ENCOUNTER — Encounter: Payer: Self-pay | Admitting: Physician Assistant

## 2021-11-21 NOTE — Progress Notes (Signed)
° °  Follow-Up Visit   Subjective  Charlene Thompson is a 71 y.o. female who presents for the following: Annual Exam (Here for skin exam. Concerns scaly scalp for months. History of atypical moles and non mole skin cancers ).   The following portions of the chart were reviewed this encounter and updated as appropriate:  Tobacco   Allergies   Meds   Problems   Med Hx   Surg Hx   Fam Hx       Objective  Well appearing patient in no apparent distress; mood and affect are within normal limits.  A full examination was performed including scalp, head, eyes, ears, nose, lips, neck, chest, axillae, abdomen, back, buttocks, bilateral upper extremities, bilateral lower extremities, hands, feet, fingers, toes, fingernails, and toenails. All findings within normal limits unless otherwise noted below.  Left Shoulder - Anterior, Right Forehead, Right Temple Erythematous patches with gritty scale.  Scalp Erythematous plaques with greasy scale.   Head - Anterior (Face) Centrifacial erythema without papules/pustules.    Assessment & Plan  Actinic keratosis (3) Left Shoulder - Anterior; Right Temple; Right Forehead  Destruction of lesion - Left Shoulder - Anterior, Right Forehead, Right Temple Complexity: simple   Destruction method: cryotherapy   Informed consent: discussed and consent obtained   Timeout:  patient name, date of birth, surgical site, and procedure verified Lesion destroyed using liquid nitrogen: Yes   Cryotherapy cycles:  1 Outcome: patient tolerated procedure well with no complications   Post-procedure details: wound care instructions given    Seborrheic dermatitis Scalp  clobetasol (TEMOVATE) 0.05 % external solution - Scalp Apply 1 application topically 2 (two) times daily.  Rosacea Head - Anterior (Face)  minocycline (MINOCIN) 50 MG capsule - Head - Anterior (Face) Take 1 capsule (50 mg total) by mouth 2 (two) times daily.    I, Rozina Pointer, PA-C, have reviewed  all documentation's for this visit.  The documentation on 11/21/21 for the exam, diagnosis, procedures and orders are all accurate and complete.

## 2021-12-23 ENCOUNTER — Other Ambulatory Visit: Payer: Self-pay | Admitting: Family Medicine

## 2022-03-12 ENCOUNTER — Other Ambulatory Visit: Payer: Self-pay | Admitting: Family Medicine

## 2022-03-22 ENCOUNTER — Other Ambulatory Visit: Payer: Self-pay

## 2022-03-22 ENCOUNTER — Other Ambulatory Visit: Payer: Self-pay | Admitting: Family Medicine

## 2022-03-22 MED ORDER — LOSARTAN POTASSIUM-HCTZ 100-25 MG PO TABS: ORAL_TABLET | ORAL | 0 refills | Status: DC

## 2022-03-26 ENCOUNTER — Other Ambulatory Visit: Payer: Self-pay

## 2022-03-26 ENCOUNTER — Inpatient Hospital Stay (HOSPITAL_COMMUNITY)
Admission: EM | Admit: 2022-03-26 | Discharge: 2022-03-28 | DRG: 418 | Disposition: A | Discharge status: Home / Self Care | Payer: Medicare Other | Attending: Internal Medicine | Admitting: Internal Medicine

## 2022-03-26 ENCOUNTER — Emergency Department (HOSPITAL_COMMUNITY): Payer: Medicare Other

## 2022-03-26 ENCOUNTER — Encounter (HOSPITAL_COMMUNITY): Payer: Self-pay | Admitting: *Deleted

## 2022-03-26 DIAGNOSIS — I248 Other forms of acute ischemic heart disease: Secondary | ICD-10-CM | POA: Diagnosis not present

## 2022-03-26 DIAGNOSIS — I160 Hypertensive urgency: Secondary | ICD-10-CM | POA: Diagnosis present

## 2022-03-26 DIAGNOSIS — R109 Unspecified abdominal pain: Secondary | ICD-10-CM | POA: Diagnosis not present

## 2022-03-26 DIAGNOSIS — K810 Acute cholecystitis: Secondary | ICD-10-CM | POA: Diagnosis not present

## 2022-03-26 DIAGNOSIS — R7303 Prediabetes: Secondary | ICD-10-CM | POA: Diagnosis present

## 2022-03-26 DIAGNOSIS — Z833 Family history of diabetes mellitus: Secondary | ICD-10-CM

## 2022-03-26 DIAGNOSIS — J9601 Acute respiratory failure with hypoxia: Secondary | ICD-10-CM | POA: Diagnosis not present

## 2022-03-26 DIAGNOSIS — R9431 Abnormal electrocardiogram [ECG] [EKG]: Secondary | ICD-10-CM | POA: Diagnosis not present

## 2022-03-26 DIAGNOSIS — R0902 Hypoxemia: Secondary | ICD-10-CM | POA: Diagnosis not present

## 2022-03-26 DIAGNOSIS — R778 Other specified abnormalities of plasma proteins: Secondary | ICD-10-CM | POA: Diagnosis not present

## 2022-03-26 DIAGNOSIS — K760 Fatty (change of) liver, not elsewhere classified: Secondary | ICD-10-CM | POA: Diagnosis not present

## 2022-03-26 DIAGNOSIS — E119 Type 2 diabetes mellitus without complications: Secondary | ICD-10-CM | POA: Diagnosis not present

## 2022-03-26 DIAGNOSIS — K8012 Calculus of gallbladder with acute and chronic cholecystitis without obstruction: Secondary | ICD-10-CM | POA: Diagnosis not present

## 2022-03-26 DIAGNOSIS — I10 Essential (primary) hypertension: Secondary | ICD-10-CM | POA: Diagnosis not present

## 2022-03-26 DIAGNOSIS — K8000 Calculus of gallbladder with acute cholecystitis without obstruction: Secondary | ICD-10-CM | POA: Diagnosis not present

## 2022-03-26 DIAGNOSIS — R7989 Other specified abnormal findings of blood chemistry: Secondary | ICD-10-CM

## 2022-03-26 DIAGNOSIS — K828 Other specified diseases of gallbladder: Secondary | ICD-10-CM | POA: Diagnosis not present

## 2022-03-26 DIAGNOSIS — Z8249 Family history of ischemic heart disease and other diseases of the circulatory system: Secondary | ICD-10-CM | POA: Diagnosis not present

## 2022-03-26 DIAGNOSIS — K819 Cholecystitis, unspecified: Secondary | ICD-10-CM | POA: Diagnosis not present

## 2022-03-26 DIAGNOSIS — R1110 Vomiting, unspecified: Secondary | ICD-10-CM | POA: Diagnosis not present

## 2022-03-26 LAB — CBC
HCT: 45.4 % (ref 36.0–46.0)
Hemoglobin: 15.3 g/dL — ABNORMAL HIGH (ref 12.0–15.0)
MCH: 30.6 pg (ref 26.0–34.0)
MCHC: 33.7 g/dL (ref 30.0–36.0)
MCV: 90.8 fL (ref 80.0–100.0)
Platelets: 207 10*3/uL (ref 150–400)
RBC: 5 MIL/uL (ref 3.87–5.11)
RDW: 12.5 % (ref 11.5–15.5)
WBC: 11.4 10*3/uL — ABNORMAL HIGH (ref 4.0–10.5)
nRBC: 0 % (ref 0.0–0.2)

## 2022-03-26 LAB — COMPREHENSIVE METABOLIC PANEL
ALT: 27 U/L (ref 0–44)
AST: 21 U/L (ref 15–41)
Albumin: 4.7 g/dL (ref 3.5–5.0)
Alkaline Phosphatase: 60 U/L (ref 38–126)
Anion gap: 8 (ref 5–15)
BUN: 18 mg/dL (ref 8–23)
CO2: 28 mmol/L (ref 22–32)
Calcium: 9.9 mg/dL (ref 8.9–10.3)
Chloride: 101 mmol/L (ref 98–111)
Creatinine, Ser: 1.09 mg/dL — ABNORMAL HIGH (ref 0.44–1.00)
GFR, Estimated: 54 mL/min — ABNORMAL LOW (ref >60)
Glucose, Bld: 167 mg/dL — ABNORMAL HIGH (ref 70–99)
Potassium: 3.5 mmol/L (ref 3.5–5.1)
Sodium: 137 mmol/L (ref 135–145)
Total Bilirubin: 0.7 mg/dL (ref 0.3–1.2)
Total Protein: 8.5 g/dL — ABNORMAL HIGH (ref 6.5–8.1)

## 2022-03-26 LAB — URINALYSIS, ROUTINE W REFLEX MICROSCOPIC
Bilirubin Urine: NEGATIVE
Glucose, UA: 50 mg/dL — AB
Hgb urine dipstick: NEGATIVE
Ketones, ur: 20 mg/dL — AB
Leukocytes,Ua: NEGATIVE
Nitrite: NEGATIVE
Protein, ur: 100 mg/dL — AB
Specific Gravity, Urine: 1.021 (ref 1.005–1.030)
pH: 5 (ref 5.0–8.0)

## 2022-03-26 LAB — TROPONIN I (HIGH SENSITIVITY): Troponin I (High Sensitivity): 18 ng/L — ABNORMAL HIGH (ref <18)

## 2022-03-26 LAB — LIPASE, BLOOD: Lipase: 26 U/L (ref 11–51)

## 2022-03-26 MED ORDER — ONDANSETRON HCL 4 MG/2ML IJ SOLN
4.0000 mg | Freq: Once | INTRAMUSCULAR | Status: AC
  Administered 2022-03-26: 4 mg via INTRAVENOUS
  Filled 2022-03-26: qty 2

## 2022-03-26 MED ORDER — IOHEXOL 300 MG/ML  SOLN
100.0000 mL | Freq: Once | INTRAMUSCULAR | Status: AC | PRN
  Administered 2022-03-26: 100 mL via INTRAVENOUS

## 2022-03-26 MED ORDER — MORPHINE SULFATE (PF) 4 MG/ML IV SOLN
4.0000 mg | Freq: Once | INTRAVENOUS | Status: AC
  Administered 2022-03-26: 4 mg via INTRAVENOUS
  Filled 2022-03-26: qty 1

## 2022-03-26 MED ORDER — SODIUM CHLORIDE 0.9 % IV SOLN
2.0000 g | Freq: Once | INTRAVENOUS | Status: AC
  Administered 2022-03-27: 2 g via INTRAVENOUS
  Filled 2022-03-26: qty 20

## 2022-03-26 MED ORDER — SODIUM CHLORIDE 0.9 % IV BOLUS
500.0000 mL | Freq: Once | INTRAVENOUS | Status: AC
  Administered 2022-03-26: 500 mL via INTRAVENOUS

## 2022-03-26 NOTE — ED Provider Notes (Signed)
[facility] Provider Note   CSN: [id number] Arrival date & time: 03/26/22  1813     History  Chief Complaint  Patient presents with   Abdominal Pain    Charlene Thompson is a 71 y.o. female.  HPI  71 year old female with past medical history of HTN presents emergency department upper abdominal pain.  Patient states that this started 2 days ago.  She describes it as epigastric and extending out along her upper abdomen, worse on the right.  Associated with nausea/vomiting.  After episodes of emesis is the only time that she gets full relief.  Pain has been waxing and waning for the past 2 days, became more severe today just prior to arrival.  No documented fever.  No history of any gallbladder disease.  She denies any diarrhea or genitourinary symptoms.  This epigastric pain does not radiate into her chest or back.  She has no associated shortness of breath.  Home Medications Prior to Admission medications   Medication Sig Start Date End Date Taking? Authorizing Provider  clobetasol (TEMOVATE) 0.05 % external solution Apply 1 application topically 2 (two) times daily. 11/15/21   Warren Danes, PA-C  clobetasol cream (TEMOVATE) 0.05 % Apply small amount to affected area qHS x 4-6 weeks 03/10/21   Nilda Simmer, NP  losartan-hydrochlorothiazide (HYZAAR) 100-25 MG tablet TAKE (1) TABLET BY MOUTH ONCE DAILY. 03/22/22   Kathyrn Drown, MD  minocycline (MINOCIN) 50 MG capsule Take 1 capsule (50 mg total) by mouth 2 (two) times daily. 11/15/21   Warren Danes, PA-C      Allergies    Valium [diazepam]    Review of Systems   Review of Systems  Constitutional:  Positive for appetite change and fatigue. Negative for fever.  Respiratory:  Negative for chest tightness and shortness of breath.   Cardiovascular:  Negative for chest pain.  Gastrointestinal:  Positive for abdominal pain, nausea and vomiting. Negative for diarrhea.  Genitourinary:  Negative for  dysuria, flank pain and frequency.  Musculoskeletal:  Negative for back pain.  Skin:  Negative for rash.  Neurological:  Negative for headaches.   Physical Exam Updated Vital Signs BP (!) 196/93   Pulse 70   Temp 97.6 F (36.4 C) (Oral)   Resp 17   Ht '5\' 6"'$  (1.676 m)   Wt 87.3 kg   SpO2 92%   BMI 31.05 kg/m  Physical Exam Vitals and nursing note reviewed.  Constitutional:      General: She is not in acute distress.    Appearance: Normal appearance.  HENT:     Head: Normocephalic.     Mouth/Throat:     Mouth: Mucous membranes are moist.  Cardiovascular:     Rate and Rhythm: Normal rate.  Pulmonary:     Effort: Pulmonary effort is normal. No respiratory distress.  Abdominal:     General: There is no distension.     Palpations: Abdomen is soft.     Tenderness: There is abdominal tenderness in the right upper quadrant. There is guarding.  Skin:    General: Skin is warm.  Neurological:     Mental Status: She is alert and oriented to person, place, and time. Mental status is at baseline.  Psychiatric:        Mood and Affect: Mood normal.    ED Results / Procedures / Treatments   Labs (all labs ordered are listed, but only abnormal results are displayed) Labs Reviewed  COMPREHENSIVE METABOLIC  PANEL - Abnormal; Notable for the following components:      Result Value   Glucose, Bld 167 (*)    Creatinine, Ser 1.09 (*)    Total Protein 8.5 (*)    GFR, Estimated 54 (*)    All other components within normal limits  CBC - Abnormal; Notable for the following components:   WBC 11.4 (*)    Hemoglobin 15.3 (*)    All other components within normal limits  URINALYSIS, ROUTINE W REFLEX MICROSCOPIC - Abnormal; Notable for the following components:   Glucose, UA 50 (*)    Ketones, ur 20 (*)    Protein, ur 100 (*)    Bacteria, UA RARE (*)    All other components within normal limits  LIPASE, BLOOD  TROPONIN I (HIGH SENSITIVITY)    EKG None  Radiology No results  found.  Procedures Procedures    Medications Ordered in ED Medications  sodium chloride 0.9 % bolus 500 mL (500 mLs Intravenous New Bag/Given 03/26/22 2308)  ondansetron (ZOFRAN) injection 4 mg (4 mg Intravenous Given 03/26/22 2308)  morphine (PF) 4 MG/ML injection 4 mg (4 mg Intravenous Given 03/26/22 2310)  iohexol (OMNIPAQUE) 300 MG/ML solution 100 mL (100 mLs Intravenous Contrast Given 03/26/22 2315)    ED Course/ Medical Decision Making/ A&P                           Medical Decision Making Amount and/or Complexity of Data Reviewed Labs: ordered. Radiology: ordered.  Risk Prescription drug management.   71 year old female presents emergency department with upper abdominal pain.  She has had this pain in the past which has been self-limited but over the past 3 days has been more persistent, associated with nausea/vomiting.  Pain is only relieved after episodes of emesis.  She is afebrile on arrival, hypertensive.  Equal palpable radial pulses, no chest/back pain or associated shortness of breath.  Abdomen is tender in the upper quadrants, specifically the right upper quadrant, positive Murphy's.  Blood work shows a mild leukocytosis of 11.4, normal lipase, normal LFTs/bilirubin.  Given the degree of tenderness CT of the abdomen pelvis will be performed.  Patient signed out pending imaging.        Final Clinical Impression(s) / ED Diagnoses Final diagnoses:  None    Rx / DC Orders ED Discharge Orders     None         Lorelle Gibbs, DO 03/26/22 2329

## 2022-03-26 NOTE — ED Triage Notes (Signed)
Pt with upper abd pain across since Saturday night. Denies diarrhea, + emesis.

## 2022-03-27 ENCOUNTER — Encounter (HOSPITAL_COMMUNITY)
Admission: EM | Disposition: A | Discharge status: Home / Self Care | Payer: Self-pay | Source: Home / Self Care | Attending: Family Medicine

## 2022-03-27 ENCOUNTER — Other Ambulatory Visit: Payer: Self-pay

## 2022-03-27 ENCOUNTER — Inpatient Hospital Stay (HOSPITAL_COMMUNITY): Payer: Medicare Other | Admitting: Anesthesiology

## 2022-03-27 ENCOUNTER — Encounter (HOSPITAL_COMMUNITY): Payer: Self-pay | Admitting: Family Medicine

## 2022-03-27 DIAGNOSIS — E119 Type 2 diabetes mellitus without complications: Secondary | ICD-10-CM | POA: Diagnosis not present

## 2022-03-27 DIAGNOSIS — K810 Acute cholecystitis: Secondary | ICD-10-CM | POA: Diagnosis not present

## 2022-03-27 DIAGNOSIS — K819 Cholecystitis, unspecified: Secondary | ICD-10-CM | POA: Diagnosis not present

## 2022-03-27 DIAGNOSIS — J9601 Acute respiratory failure with hypoxia: Secondary | ICD-10-CM

## 2022-03-27 DIAGNOSIS — I248 Other forms of acute ischemic heart disease: Secondary | ICD-10-CM | POA: Diagnosis present

## 2022-03-27 DIAGNOSIS — R778 Other specified abnormalities of plasma proteins: Secondary | ICD-10-CM | POA: Diagnosis not present

## 2022-03-27 DIAGNOSIS — I10 Essential (primary) hypertension: Secondary | ICD-10-CM | POA: Diagnosis present

## 2022-03-27 DIAGNOSIS — I160 Hypertensive urgency: Secondary | ICD-10-CM | POA: Diagnosis present

## 2022-03-27 DIAGNOSIS — Z833 Family history of diabetes mellitus: Secondary | ICD-10-CM | POA: Diagnosis not present

## 2022-03-27 DIAGNOSIS — K8000 Calculus of gallbladder with acute cholecystitis without obstruction: Secondary | ICD-10-CM | POA: Diagnosis present

## 2022-03-27 DIAGNOSIS — R0902 Hypoxemia: Secondary | ICD-10-CM | POA: Diagnosis not present

## 2022-03-27 DIAGNOSIS — R7303 Prediabetes: Secondary | ICD-10-CM | POA: Diagnosis present

## 2022-03-27 DIAGNOSIS — Z8249 Family history of ischemic heart disease and other diseases of the circulatory system: Secondary | ICD-10-CM | POA: Diagnosis not present

## 2022-03-27 HISTORY — PX: CHOLECYSTECTOMY: SHX55

## 2022-03-27 LAB — CBC WITH DIFFERENTIAL/PLATELET
Abs Immature Granulocytes: 0.05 10*3/uL (ref 0.00–0.07)
Basophils Absolute: 0 10*3/uL (ref 0.0–0.1)
Basophils Relative: 0 %
Eosinophils Absolute: 0 10*3/uL (ref 0.0–0.5)
Eosinophils Relative: 0 %
HCT: 41.3 % (ref 36.0–46.0)
Hemoglobin: 14 g/dL (ref 12.0–15.0)
Immature Granulocytes: 0 %
Lymphocytes Relative: 7 %
Lymphs Abs: 0.8 10*3/uL (ref 0.7–4.0)
MCH: 30.7 pg (ref 26.0–34.0)
MCHC: 33.9 g/dL (ref 30.0–36.0)
MCV: 90.6 fL (ref 80.0–100.0)
Monocytes Absolute: 0.8 10*3/uL (ref 0.1–1.0)
Monocytes Relative: 6 %
Neutro Abs: 10.6 10*3/uL — ABNORMAL HIGH (ref 1.7–7.7)
Neutrophils Relative %: 87 %
Platelets: 176 10*3/uL (ref 150–400)
RBC: 4.56 MIL/uL (ref 3.87–5.11)
RDW: 12.4 % (ref 11.5–15.5)
WBC: 12.3 10*3/uL — ABNORMAL HIGH (ref 4.0–10.5)
nRBC: 0 % (ref 0.0–0.2)

## 2022-03-27 LAB — HEMOGLOBIN A1C
Hgb A1c MFr Bld: 5.8 % — ABNORMAL HIGH (ref 4.8–5.6)
Mean Plasma Glucose: 119.76 mg/dL

## 2022-03-27 LAB — COMPREHENSIVE METABOLIC PANEL
ALT: 21 U/L (ref 0–44)
AST: 16 U/L (ref 15–41)
Albumin: 3.9 g/dL (ref 3.5–5.0)
Alkaline Phosphatase: 51 U/L (ref 38–126)
Anion gap: 11 (ref 5–15)
BUN: 17 mg/dL (ref 8–23)
CO2: 25 mmol/L (ref 22–32)
Calcium: 9.1 mg/dL (ref 8.9–10.3)
Chloride: 105 mmol/L (ref 98–111)
Creatinine, Ser: 0.74 mg/dL (ref 0.44–1.00)
GFR, Estimated: >60 mL/min (ref >60)
Glucose, Bld: 148 mg/dL — ABNORMAL HIGH (ref 70–99)
Potassium: 3.4 mmol/L — ABNORMAL LOW (ref 3.5–5.1)
Sodium: 141 mmol/L (ref 135–145)
Total Bilirubin: 0.3 mg/dL (ref 0.3–1.2)
Total Protein: 7.4 g/dL (ref 6.5–8.1)

## 2022-03-27 LAB — TROPONIN I (HIGH SENSITIVITY)
Troponin I (High Sensitivity): 42 ng/L — ABNORMAL HIGH (ref <18)
Troponin I (High Sensitivity): 41 ng/L — ABNORMAL HIGH (ref <18)

## 2022-03-27 LAB — MAGNESIUM: Magnesium: 2.1 mg/dL (ref 1.7–2.4)

## 2022-03-27 SURGERY — LAPAROSCOPIC CHOLECYSTECTOMY
Anesthesia: General | Site: Abdomen

## 2022-03-27 MED ORDER — HEPARIN SODIUM (PORCINE) 5000 UNIT/ML IJ SOLN
5000.0000 [IU] | Freq: Three times a day (TID) | INTRAMUSCULAR | Status: DC
  Administered 2022-03-27 – 2022-03-28 (×2): 5000 [IU] via SUBCUTANEOUS
  Filled 2022-03-27 (×2): qty 1

## 2022-03-27 MED ORDER — MORPHINE SULFATE (PF) 2 MG/ML IV SOLN: 2.0000 mg | INTRAVENOUS | Status: DC | PRN

## 2022-03-27 MED ORDER — HYDROCHLOROTHIAZIDE 25 MG PO TABS
25.0000 mg | ORAL_TABLET | Freq: Every day | ORAL | Status: DC
  Administered 2022-03-27: 25 mg via ORAL
  Filled 2022-03-27: qty 1

## 2022-03-27 MED ORDER — PHENYLEPHRINE 80 MCG/ML (10ML) SYRINGE FOR IV PUSH (FOR BLOOD PRESSURE SUPPORT)
PREFILLED_SYRINGE | INTRAVENOUS | Status: AC
  Filled 2022-03-27: qty 20

## 2022-03-27 MED ORDER — FENTANYL CITRATE PF 50 MCG/ML IJ SOSY
25.0000 ug | PREFILLED_SYRINGE | INTRAMUSCULAR | Status: DC | PRN
  Administered 2022-03-27: 50 ug via INTRAVENOUS
  Filled 2022-03-27: qty 1

## 2022-03-27 MED ORDER — PHENYLEPHRINE 80 MCG/ML (10ML) SYRINGE FOR IV PUSH (FOR BLOOD PRESSURE SUPPORT)
PREFILLED_SYRINGE | INTRAVENOUS | Status: DC | PRN
  Administered 2022-03-27: 160 ug via INTRAVENOUS
  Administered 2022-03-27: 80 ug via INTRAVENOUS

## 2022-03-27 MED ORDER — ONDANSETRON HCL 4 MG/2ML IJ SOLN: 4.0000 mg | Freq: Once | INTRAMUSCULAR | Status: AC

## 2022-03-27 MED ORDER — ONDANSETRON HCL 4 MG/2ML IJ SOLN: 4.0000 mg | Freq: Once | INTRAMUSCULAR | Status: DC | PRN

## 2022-03-27 MED ORDER — FENTANYL CITRATE (PF) 100 MCG/2ML IJ SOLN
INTRAMUSCULAR | Status: AC
  Filled 2022-03-27: qty 2

## 2022-03-27 MED ORDER — ACETAMINOPHEN 650 MG RE SUPP: 650.0000 mg | Freq: Four times a day (QID) | RECTAL | Status: DC | PRN

## 2022-03-27 MED ORDER — ACETAMINOPHEN 10 MG/ML IV SOLN
1000.0000 mg | Freq: Once | INTRAVENOUS | Status: AC
  Administered 2022-03-27: 1000 mg via INTRAVENOUS
  Filled 2022-03-27: qty 100

## 2022-03-27 MED ORDER — LIDOCAINE HCL (PF) 2 % IJ SOLN
INTRAMUSCULAR | Status: AC
  Filled 2022-03-27: qty 5

## 2022-03-27 MED ORDER — ONDANSETRON HCL 4 MG PO TABS: 4.0000 mg | ORAL_TABLET | Freq: Four times a day (QID) | ORAL | Status: DC | PRN

## 2022-03-27 MED ORDER — ACETAMINOPHEN 500 MG PO TABS
1000.0000 mg | ORAL_TABLET | Freq: Four times a day (QID) | ORAL | Status: DC
  Administered 2022-03-27 – 2022-03-28 (×2): 1000 mg via ORAL
  Filled 2022-03-27 (×2): qty 2

## 2022-03-27 MED ORDER — HYDRALAZINE HCL 20 MG/ML IJ SOLN: 10.0000 mg | INTRAMUSCULAR | Status: DC | PRN

## 2022-03-27 MED ORDER — SODIUM CHLORIDE 0.9 % IR SOLN
Status: DC | PRN
  Administered 2022-03-27: 1000 mL

## 2022-03-27 MED ORDER — VECURONIUM BROMIDE 10 MG IV SOLR
INTRAVENOUS | Status: AC
  Filled 2022-03-27: qty 10

## 2022-03-27 MED ORDER — SODIUM CHLORIDE 0.9 % IV SOLN
2.0000 g | INTRAVENOUS | Status: DC
  Administered 2022-03-27: 2 g via INTRAVENOUS

## 2022-03-27 MED ORDER — ROCURONIUM BROMIDE 10 MG/ML (PF) SYRINGE
PREFILLED_SYRINGE | INTRAVENOUS | Status: AC
  Filled 2022-03-27: qty 10

## 2022-03-27 MED ORDER — METRONIDAZOLE 500 MG/100ML IV SOLN
500.0000 mg | Freq: Three times a day (TID) | INTRAVENOUS | Status: DC
  Administered 2022-03-27 – 2022-03-28 (×2): 500 mg via INTRAVENOUS
  Filled 2022-03-27 (×2): qty 100

## 2022-03-27 MED ORDER — KETOROLAC TROMETHAMINE 30 MG/ML IJ SOLN
INTRAMUSCULAR | Status: AC
  Filled 2022-03-27: qty 1

## 2022-03-27 MED ORDER — ROCURONIUM BROMIDE 10 MG/ML (PF) SYRINGE
PREFILLED_SYRINGE | INTRAVENOUS | Status: DC | PRN
  Administered 2022-03-27: 10 mg via INTRAVENOUS
  Administered 2022-03-27: 40 mg via INTRAVENOUS
  Administered 2022-03-27: 10 mg via INTRAVENOUS

## 2022-03-27 MED ORDER — ORAL CARE MOUTH RINSE
15.0000 mL | Freq: Once | OROMUCOSAL | Status: AC
Start: 2022-03-27 — End: 2022-03-27

## 2022-03-27 MED ORDER — HEMOSTATIC AGENTS (NO CHARGE) OPTIME
TOPICAL | Status: DC | PRN
  Administered 2022-03-27 (×3): 1

## 2022-03-27 MED ORDER — DEXAMETHASONE SODIUM PHOSPHATE 4 MG/ML IJ SOLN
INTRAMUSCULAR | Status: DC | PRN
  Administered 2022-03-27: 5 mg via INTRAVENOUS

## 2022-03-27 MED ORDER — CHLORHEXIDINE GLUCONATE 0.12 % MT SOLN
15.0000 mL | Freq: Once | OROMUCOSAL | Status: AC
  Administered 2022-03-27: 15 mL via OROMUCOSAL

## 2022-03-27 MED ORDER — SEVOFLURANE IN SOLN
RESPIRATORY_TRACT | Status: AC
  Filled 2022-03-27: qty 250

## 2022-03-27 MED ORDER — SODIUM CHLORIDE 0.9 % IV SOLN: INTRAVENOUS | Status: DC

## 2022-03-27 MED ORDER — OXYCODONE HCL 5 MG PO TABS: 5.0000 mg | ORAL_TABLET | ORAL | Status: DC | PRN

## 2022-03-27 MED ORDER — ONDANSETRON HCL 4 MG/2ML IJ SOLN
INTRAMUSCULAR | Status: AC
  Administered 2022-03-27: 4 mg via INTRAVENOUS
  Filled 2022-03-27: qty 2

## 2022-03-27 MED ORDER — HYDRALAZINE HCL 20 MG/ML IJ SOLN
10.0000 mg | Freq: Four times a day (QID) | INTRAMUSCULAR | Status: DC | PRN
  Administered 2022-03-27: 10 mg via INTRAVENOUS
  Filled 2022-03-27: qty 1

## 2022-03-27 MED ORDER — HYDROMORPHONE HCL 1 MG/ML IJ SOLN
0.5000 mg | INTRAMUSCULAR | Status: DC | PRN
  Administered 2022-03-27 (×3): 0.5 mg via INTRAVENOUS
  Filled 2022-03-27 (×3): qty 0.5

## 2022-03-27 MED ORDER — SUCCINYLCHOLINE CHLORIDE 200 MG/10ML IV SOSY
PREFILLED_SYRINGE | INTRAVENOUS | Status: AC
  Filled 2022-03-27: qty 10

## 2022-03-27 MED ORDER — LACTATED RINGERS IV SOLN: INTRAVENOUS | Status: DC

## 2022-03-27 MED ORDER — SUGAMMADEX SODIUM 200 MG/2ML IV SOLN
INTRAVENOUS | Status: DC | PRN
  Administered 2022-03-27: 100 mg via INTRAVENOUS

## 2022-03-27 MED ORDER — LOSARTAN POTASSIUM 50 MG PO TABS
100.0000 mg | ORAL_TABLET | Freq: Every day | ORAL | Status: DC
  Administered 2022-03-27: 100 mg via ORAL
  Filled 2022-03-27: qty 2

## 2022-03-27 MED ORDER — LIDOCAINE HCL (CARDIAC) PF 100 MG/5ML IV SOSY
PREFILLED_SYRINGE | INTRAVENOUS | Status: DC | PRN
  Administered 2022-03-27: 60 mg via INTRAVENOUS

## 2022-03-27 MED ORDER — BUPIVACAINE HCL (PF) 0.5 % IJ SOLN
INTRAMUSCULAR | Status: AC
  Filled 2022-03-27: qty 30

## 2022-03-27 MED ORDER — BUPIVACAINE HCL (PF) 0.5 % IJ SOLN
INTRAMUSCULAR | Status: DC | PRN
  Administered 2022-03-27: 24 mL

## 2022-03-27 MED ORDER — ACETAMINOPHEN 325 MG PO TABS: 650.0000 mg | ORAL_TABLET | Freq: Four times a day (QID) | ORAL | Status: DC | PRN

## 2022-03-27 MED ORDER — FENTANYL CITRATE (PF) 100 MCG/2ML IJ SOLN
INTRAMUSCULAR | Status: DC | PRN
  Administered 2022-03-27 (×3): 50 ug via INTRAVENOUS

## 2022-03-27 MED ORDER — ONDANSETRON HCL 4 MG/2ML IJ SOLN
4.0000 mg | Freq: Four times a day (QID) | INTRAMUSCULAR | Status: DC | PRN
  Administered 2022-03-27: 4 mg via INTRAVENOUS

## 2022-03-27 MED ORDER — METRONIDAZOLE 500 MG/100ML IV SOLN
INTRAVENOUS | Status: AC
  Administered 2022-03-27: 500 mg via INTRAVENOUS
  Filled 2022-03-27: qty 100

## 2022-03-27 MED ORDER — EPHEDRINE 5 MG/ML INJ
INTRAVENOUS | Status: AC
  Filled 2022-03-27: qty 10

## 2022-03-27 MED ORDER — ONDANSETRON HCL 4 MG/2ML IJ SOLN
INTRAMUSCULAR | Status: AC
  Filled 2022-03-27: qty 2

## 2022-03-27 MED ORDER — SODIUM CHLORIDE 0.9 % IR SOLN
Status: DC | PRN
  Administered 2022-03-27: 3000 mL

## 2022-03-27 MED ORDER — LOSARTAN POTASSIUM-HCTZ 100-25 MG PO TABS: 1.0000 | ORAL_TABLET | Freq: Every day | ORAL | Status: DC

## 2022-03-27 MED ORDER — SUCCINYLCHOLINE CHLORIDE 200 MG/10ML IV SOSY
PREFILLED_SYRINGE | INTRAVENOUS | Status: DC | PRN
  Administered 2022-03-27: 120 mg via INTRAVENOUS

## 2022-03-27 MED ORDER — PROPOFOL 10 MG/ML IV BOLUS
INTRAVENOUS | Status: AC
  Filled 2022-03-27: qty 20

## 2022-03-27 MED ORDER — PROPOFOL 10 MG/ML IV BOLUS
INTRAVENOUS | Status: DC | PRN
  Administered 2022-03-27: 120 mg via INTRAVENOUS

## 2022-03-27 MED ORDER — SODIUM CHLORIDE 0.9 % IV SOLN
INTRAVENOUS | Status: AC
  Filled 2022-03-27: qty 20

## 2022-03-27 SURGICAL SUPPLY — 49 items
ADH SKN CLS APL DERMABOND .7 (GAUZE/BANDAGES/DRESSINGS) ×1
APPLIER CLIP ROT 10 11.4 M/L (STAPLE) ×2
BAG RETRIEVAL 10 (BASKET) ×1
BLADE SURG 15 STRL LF DISP TIS (BLADE) ×1 IMPLANT
BLADE SURG 15 STRL SS (BLADE) ×4
CHLORAPREP W/TINT 26 (MISCELLANEOUS) ×2 IMPLANT
CLIP APPLIE ROT 10 11.4 M/L (STAPLE) ×1 IMPLANT
CLOTH BEACON ORANGE TIMEOUT ST (SAFETY) ×2 IMPLANT
COVER LIGHT HANDLE STERIS (MISCELLANEOUS) ×4 IMPLANT
CUTTER FLEX LINEAR 45M (STAPLE) ×1 IMPLANT
DERMABOND ADVANCED (GAUZE/BANDAGES/DRESSINGS) ×1
DERMABOND ADVANCED .7 DNX12 (GAUZE/BANDAGES/DRESSINGS) ×1 IMPLANT
ELECT REM PT RETURN 9FT ADLT (ELECTROSURGICAL) ×2
ELECTRODE REM PT RTRN 9FT ADLT (ELECTROSURGICAL) ×1 IMPLANT
GAUZE 4X4 16PLY ~~LOC~~+RFID DBL (SPONGE) ×1 IMPLANT
GLOVE BIOGEL PI IND STRL 6.5 (GLOVE) ×1 IMPLANT
GLOVE BIOGEL PI IND STRL 7.0 (GLOVE) ×2 IMPLANT
GLOVE BIOGEL PI INDICATOR 6.5 (GLOVE) ×2
GLOVE BIOGEL PI INDICATOR 7.0 (GLOVE) ×3
GLOVE SURG SS PI 6.5 STRL IVOR (GLOVE) ×5 IMPLANT
GOWN STRL REUS W/TWL LRG LVL3 (GOWN DISPOSABLE) ×6 IMPLANT
HEMOSTAT SNOW SURGICEL 2X4 (HEMOSTASIS) ×3 IMPLANT
INST SET LAPROSCOPIC AP (KITS) ×2 IMPLANT
IV NS IRRIG 3000ML ARTHROMATIC (IV SOLUTION) ×1 IMPLANT
KIT TURNOVER KIT A (KITS) ×2 IMPLANT
MANIFOLD NEPTUNE II (INSTRUMENTS) ×2 IMPLANT
NDL INSUFFLATION 14GA 120MM (NEEDLE) ×1 IMPLANT
NEEDLE INSUFFLATION 14GA 120MM (NEEDLE) ×2 IMPLANT
NS IRRIG 1000ML POUR BTL (IV SOLUTION) ×2 IMPLANT
PACK LAP CHOLE LZT030E (CUSTOM PROCEDURE TRAY) ×2 IMPLANT
PAD ARMBOARD 7.5X6 YLW CONV (MISCELLANEOUS) ×2 IMPLANT
PENCIL HANDSWITCHING (ELECTRODE) ×1 IMPLANT
POWDER SURGICEL 3.0 GRAM (HEMOSTASIS) ×1 IMPLANT
RELOAD 45 VASCULAR/THIN (ENDOMECHANICALS) ×2 IMPLANT
RELOAD STAPLE 45 2.5 WHT GRN (ENDOMECHANICALS) IMPLANT
SET BASIN LINEN APH (SET/KITS/TRAYS/PACK) ×2 IMPLANT
SET TUBE IRRIG SUCTION NO TIP (IRRIGATION / IRRIGATOR) ×1 IMPLANT
SET TUBE SMOKE EVAC HIGH FLOW (TUBING) ×2 IMPLANT
SLEEVE Z-THREAD 5X100MM (TROCAR) ×2 IMPLANT
SUT MNCRL AB 4-0 PS2 18 (SUTURE) ×4 IMPLANT
SUT VICRYL 0 UR6 27IN ABS (SUTURE) ×3 IMPLANT
SYS BAG RETRIEVAL 10MM (BASKET) ×1
SYSTEM BAG RETRIEVAL 10MM (BASKET) ×1 IMPLANT
TIP ENDOSCOPIC SURGICEL (TIP) ×1 IMPLANT
TROCAR Z-THRD FIOS HNDL 11X100 (TROCAR) ×2 IMPLANT
TROCAR Z-THREAD FIOS 5X100MM (TROCAR) ×2 IMPLANT
TROCAR Z-THREAD SLEEVE 11X100 (TROCAR) ×2 IMPLANT
TUBE CONNECTING 12X1/4 (SUCTIONS) ×2 IMPLANT
WARMER LAPAROSCOPE (MISCELLANEOUS) ×2 IMPLANT

## 2022-03-27 NOTE — H&P (Signed)
History and Physical    Patient: Charlene Thompson YDX:[id number] DOB: 24-Feb-1951 DOA: 03/26/2022 DOS: the patient was seen and examined on 03/27/2022 PCP: Kathyrn Drown, MD  Patient coming from: Home  Chief Complaint:  Chief Complaint  Patient presents with   Abdominal Pain   HPI: Charlene Thompson is a 71 y.o. female with medical history significant of basal cell carcinoma of the skin, hypertension, prediabetes, presents the ED with a chief complaint of abdominal pain.  The abdominal pain started 3 days ago.  It is waxed and waned since then.  Eating makes it worse.  Last meal was approximately 13 hours prior to admission.  Last bowel movement was at 5:30 PM on 29 May.  Patient denies any diarrhea, hematochezia, or melena.  Patient describes her abdominal pain as radiating across both sides from her epigastric region.  She has had nausea and vomiting.  Vomiting seems to be the only thing that relieves the pain temporarily.  Patient denies any fevers but reports that she has been clammy.  Patient has no other complaints at this time.  Patient does not smoke, does not drink alcohol, does not use illicit drugs.  She is vaccinated for COVID.  Patient is full code. Review of Systems: As mentioned in the history of present illness. All other systems reviewed and are negative. Past Medical History:  Diagnosis Date   Atypical nevus 09/06/2005   atypical neoplasm- right axilla (EXC)   Atypical nevus 05/16/2018   severe-right upper lip (WS)   Basal cell carcinoma 12/08/2008   with sclerosis-left ear rim (CX35FU)   Basal cell carcinoma of skin 06/13/2012   right side nose (MOHS)   Basal cell carcinoma of skin 11/21/2012   chest (txpbx)   Basal cell carcinoma of skin 05/07/2017   nodular-right post shoulder   Hypertension    Squamous cell carcinoma of skin 07/10/2013   left uper arm (CX35FU)   Past Surgical History:  Procedure Laterality Date   BASAL CELL CARCINOMA EXCISION     nose   CARPAL  TUNNEL RELEASE Right    Social History:  reports that she has never smoked. She has never used smokeless tobacco. She reports current alcohol use. She reports that she does not use drugs.  Allergies  Allergen Reactions   Valium [Diazepam]     Severe headache     Family History  Problem Relation Age of Onset   Diabetes Mother    Heart disease Mother    Diabetes Sister        half-sister   Diabetes Brother        half-brother   Heart disease Brother    Kidney disease Sister        half-sister    Prior to Admission medications   Medication Sig Start Date End Date Taking? Authorizing Provider  clobetasol (TEMOVATE) 0.05 % external solution Apply 1 application topically 2 (two) times daily. 11/15/21   Warren Danes, PA-C  clobetasol cream (TEMOVATE) 0.05 % Apply small amount to affected area qHS x 4-6 weeks 03/10/21   Nilda Simmer, NP  losartan-hydrochlorothiazide (HYZAAR) 100-25 MG tablet TAKE (1) TABLET BY MOUTH ONCE DAILY. 03/22/22   Kathyrn Drown, MD  minocycline (MINOCIN) 50 MG capsule Take 1 capsule (50 mg total) by mouth 2 (two) times daily. 11/15/21   Warren Danes, PA-C    Physical Exam: Vitals:   03/27/22 0030 03/27/22 0100 03/27/22 0203 03/27/22 0300  BP: (!) 163/78 (!) 190/87 Marland Kitchen)  161/62   Pulse: 90 60 72   Resp: '12 20 18   '$ Temp:  98.1 F (36.7 C) 98 F (36.7 C)   TempSrc:  Oral Oral   SpO2: 98% 99% 100%   Weight:    87.2 kg  Height:    '5\' 6"'$  (1.676 m)   1.  General: Patient lying supine in bed,  no acute distress   2. Psychiatric: Alert and oriented x 3, mood and behavior normal for situation, pleasant and cooperative with exam   3. Neurologic: Speech and language are normal, face is symmetric, moves all 4 extremities voluntarily, at baseline without acute deficits on limited exam   4. HEENMT:  Head is atraumatic, normocephalic, pupils reactive to light, neck is supple, trachea is midline, mucous membranes are moist   5. Respiratory  : Lungs are clear to auscultation bilaterally without wheezing, rhonchi, rales, no cyanosis, no increase in work of breathing or accessory muscle use   6. Cardiovascular : Heart rate normal, rhythm is regular, no murmurs, rubs or gallops, no peripheral edema, peripheral pulses palpated   7. Gastrointestinal:  Abdomen is soft, nondistended, nontender to palpation at the time of my exam, bowel sounds active, no masses or organomegaly palpated   8. Skin:  Skin is warm, dry and intact without rashes, acute lesions, or ulcers on limited exam   9.Musculoskeletal:  No acute deformities or trauma, no asymmetry in tone, no peripheral edema, peripheral pulses palpated, no tenderness to palpation in the extremities  Data Reviewed: In the ED Temp 97.6, heart rate 59-75, respiratory rate 17-18, blood pressure 174/90-207/113 Borderline leukocytosis with white blood cell count of 11.4, hemoglobin 15.3, platelets 207 Chemistry is unremarkable aside from glucose 167 Lipase 26 Troponin 18, 42 CT shows cholecystitis EKG shows a heart rate of 71, sinus rhythm, QTc 457 Rocephin 2 g started in the ED, continued admission Dilaudid 0.5 mg given Morphine 4 mg given Zofran 4 mg given Normal saline bolus 500 mL UA is not indicative of UTI Admission requested by general surgery for cholecystitis  Assessment and Plan: * Cholecystitis - The scan shows cholecystitis with cholelithiasis -General surgery consulted from the ED and plans for surgery in the a.m. -N.p.o. -Mild leukocytosis likely related to cholecystitis -Continue Rocephin, continue pain control -UA is not indicative of UTI -Treat nausea with Zofran -Continue to monitor  Elevated troponin - Likely demand ischemia in the setting of hypoxia and hypertensive urgency -Continue oxygen supplementation -Cycle troponins with morning labs -Patient is chest pain-free -EKG is without acute ischemic changes   Acute respiratory failure with hypoxia  (HCC) - Patient requiring 4 L nasal cannula to maintain her oxygen saturations after being given a dose of Dilaudid and falling asleep -Patient has no history of requirement for oxygen supplementation -She does not feel short of breath or have chest pain -Troponin 18, and then 42 -EKG is without ST changes -This is most likely related to the Dilaudid  Prediabetes - History of prediabetes with last hemoglobin A1c being 1 year ago 6.1 -Hyperglycemic in the ED at 167 -Update hemoglobin A1c -Continue to monitor  Hypertension - Continue losartan and hydrochlorothiazide -Blood pressure very high at arrival 213 systolic -as needed hydralazine added -Continue to monitor      Advance Care Planning:   Code Status: Full Code   Consults: General surgery  Family Communication: No family at bedside  Severity of Illness: The appropriate patient status for this patient is INPATIENT. Inpatient status is judged to be  reasonable and necessary in order to provide the required intensity of service to ensure the patient's safety. The patient's presenting symptoms, physical exam findings, and initial radiographic and laboratory data in the context of their chronic comorbidities is felt to place them at high risk for further clinical deterioration. Furthermore, it is not anticipated that the patient will be medically stable for discharge from the hospital within 2 midnights of admission.   * I certify that at the point of admission it is my clinical judgment that the patient will require inpatient hospital care spanning beyond 2 midnights from the point of admission due to high intensity of service, high risk for further deterioration and high frequency of surveillance required.*  Author: Rolla Plate, DO 03/27/2022 4:36 AM  For on call review [email or web address].

## 2022-03-27 NOTE — Consult Note (Signed)
[facility] Consult  Reason for Consult: Acute cholecystitis Referring Physician: Dr. Waverly Ferrari  Chief Complaint   Abdominal Pain     HPI: Charlene Thompson is a 71 y.o. female who presented to the emergency department with a 3-day history of worsening abdominal pain.  She states that her abdominal pain was brought on by eating out, and it failed to improve, so she presented to the emergency department.  She denies nausea but does confirm episodes of emesis.  She states that she has had issues with abdominal pain like this for about the last year, but the pain always resolved on its own.  It was always associated with eating out.  She denies fevers and chills.  She last had a bowel movement yesterday evening in the hospital.  She denies any history of abdominal surgeries.  She denies use of blood thinning medications, tobacco products, and illicit drugs.  She very rarely drinks alcohol.  In the ED, she underwent a CT abdomen and pelvis which demonstrated cholelithiasis with suspected acute cholecystitis.  She had a leukocytosis of 11.4 and LFTs were within normal limits.  Past Medical History:  Diagnosis Date   Atypical nevus 09/06/2005   atypical neoplasm- right axilla (EXC)   Atypical nevus 05/16/2018   severe-right upper lip (WS)   Basal cell carcinoma 12/08/2008   with sclerosis-left ear rim (CX35FU)   Basal cell carcinoma of skin 06/13/2012   right side nose (MOHS)   Basal cell carcinoma of skin 11/21/2012   chest (txpbx)   Basal cell carcinoma of skin 05/07/2017   nodular-right post shoulder   Hypertension    Squamous cell carcinoma of skin 07/10/2013   left uper arm (CX35FU)    Past Surgical History:  Procedure Laterality Date   BASAL CELL CARCINOMA EXCISION     nose   CARPAL TUNNEL RELEASE Right     Family History  Problem Relation Age of Onset   Diabetes Mother    Heart disease Mother    Diabetes Sister        half-sister   Diabetes Brother         half-brother   Heart disease Brother    Kidney disease Sister        half-sister    Social History   Tobacco Use   Smoking status: Never   Smokeless tobacco: Never  Substance Use Topics   Alcohol use: Yes    Alcohol/week: 0.0 standard drinks    Comment: occasional   Drug use: No    Medications: I have reviewed the patient's current medications.  Allergies  Allergen Reactions   Valium [Diazepam]     Severe headache      ROS:  Constitutional: positive for headaches, negative for fatigue, fevers, and malaise Respiratory: negative for cough and shortness of breath Cardiovascular: negative for chest pain and palpitations Gastrointestinal: positive for abdominal pain, nausea, and vomiting, negative for constipation and reflux symptoms Musculoskeletal:negative for back pain  Blood pressure (!) 155/82, pulse 81, temperature 98.2 F (36.8 C), resp. rate 18, height '5\' 6"'$  (1.676 m), weight 87.2 kg, SpO2 98 %. Physical Exam Vitals reviewed.  Constitutional:      Appearance: She is well-developed.  HENT:     Head: Normocephalic and atraumatic.  Cardiovascular:     Rate and Rhythm: Normal rate.  Pulmonary:     Effort: Pulmonary effort is normal.  Abdominal:     Comments: Abdomen soft, nondistended, no percussion tenderness, tenderness to palpation in epigastrium and  right upper quadrant, positive Murphy's; no rigidity, guarding, rebound tenderness  Skin:    General: Skin is warm and dry.  Neurological:     General: No focal deficit present.     Mental Status: She is alert and oriented to person, place, and time.  Psychiatric:        Mood and Affect: Mood normal.        Behavior: Behavior normal.    Results: Results for orders placed or performed during the hospital encounter of 03/26/22 (from the past 48 hour(s))  Lipase, blood     Status: None   Collection Time: 03/26/22  6:56 PM  Result Value Ref Range   Lipase 26 11 - 51 U/L    Comment: Performed at [facility], [address] [place], [place] [address]  Comprehensive metabolic panel     Status: Abnormal   Collection Time: 03/26/22  6:56 PM  Result Value Ref Range   Sodium 137 135 - 145 mmol/L   Potassium 3.5 3.5 - 5.1 mmol/L   Chloride 101 98 - 111 mmol/L   CO2 28 22 - 32 mmol/L   Glucose, Bld 167 (H) 70 - 99 mg/dL    Comment: Glucose reference range applies only to samples taken after fasting for at least 8 hours.   BUN 18 8 - 23 mg/dL   Creatinine, Ser 1.09 (H) 0.44 - 1.00 mg/dL   Calcium 9.9 8.9 - 10.3 mg/dL   Total Protein 8.5 (H) 6.5 - 8.1 g/dL   Albumin 4.7 3.5 - 5.0 g/dL   AST 21 15 - 41 U/L   ALT 27 0 - 44 U/L   Alkaline Phosphatase 60 38 - 126 U/L   Total Bilirubin 0.7 0.3 - 1.2 mg/dL   GFR, Estimated 54 (L) >60 mL/min    Comment: (NOTE) Calculated using the CKD-EPI Creatinine Equation (2021)    Anion gap 8 5 - 15    Comment: Performed at [facility], [address] [place], [place] [address]  CBC     Status: Abnormal   Collection Time: 03/26/22  6:56 PM  Result Value Ref Range   WBC 11.4 (H) 4.0 - 10.5 K/uL   RBC 5.00 3.87 - 5.11 MIL/uL   Hemoglobin 15.3 (H) 12.0 - 15.0 g/dL   HCT 45.4 36.0 - 46.0 %   MCV 90.8 80.0 - 100.0 fL   MCH 30.6 26.0 - 34.0 pg   MCHC 33.7 30.0 - 36.0 g/dL   RDW 12.5 11.5 - 15.5 %   Platelets 207 150 - 400 K/uL   nRBC 0.0 0.0 - 0.2 %    Comment: Performed at [facility], [address] [place], [place] [address]  Troponin I (High Sensitivity)     Status: Abnormal   Collection Time: 03/26/22  6:56 PM  Result Value Ref Range   Troponin I (High Sensitivity) 18 (H) <18 ng/L    Comment: (NOTE) Elevated high sensitivity troponin I (hsTnI) values and significant  changes across serial measurements may suggest ACS but many other  chronic and acute conditions are known to elevate hsTnI results.  Refer to the "Links" section for chest pain algorithms and additional  guidance. Performed at [facility], [address] [place], [place] [address]    Urinalysis, Routine w reflex microscopic Urine, Clean Catch     Status: Abnormal   Collection Time: 03/26/22  9:45 PM  Result Value Ref Range   Color, Urine YELLOW YELLOW   APPearance CLEAR CLEAR  Specific Gravity, Urine 1.021 1.005 - 1.030   pH 5.0 5.0 - 8.0   Glucose, UA 50 (A) NEGATIVE mg/dL   Hgb urine dipstick NEGATIVE NEGATIVE   Bilirubin Urine NEGATIVE NEGATIVE   Ketones, ur 20 (A) NEGATIVE mg/dL   Protein, ur 100 (A) NEGATIVE mg/dL   Nitrite NEGATIVE NEGATIVE   Leukocytes,Ua NEGATIVE NEGATIVE   RBC / HPF 0-5 0 - 5 RBC/hpf   WBC, UA 0-5 0 - 5 WBC/hpf   Bacteria, UA RARE (A) NONE SEEN   Squamous Epithelial / LPF 0-5 0 - 5   Mucus PRESENT     Comment: Performed at [facility], [address] [place], [place] [address]  Troponin I (High Sensitivity)     Status: Abnormal   Collection Time: 03/27/22  1:09 AM  Result Value Ref Range   Troponin I (High Sensitivity) 42 (H) <18 ng/L    Comment: DELTA CHECK NOTED (NOTE) Elevated high sensitivity troponin I (hsTnI) values and significant  changes across serial measurements may suggest ACS but many other  chronic and acute conditions are known to elevate hsTnI results.  Refer to the Links section for chest pain algorithms and additional  guidance. Performed at [facility], [address] [place], [place] [address]   Comprehensive metabolic panel     Status: Abnormal   Collection Time: 03/27/22  4:26 AM  Result Value Ref Range   Sodium 141 135 - 145 mmol/L   Potassium 3.4 (L) 3.5 - 5.1 mmol/L   Chloride 105 98 - 111 mmol/L   CO2 25 22 - 32 mmol/L   Glucose, Bld 148 (H) 70 - 99 mg/dL    Comment: Glucose reference range applies only to samples taken after fasting for at least 8 hours.   BUN 17 8 - 23 mg/dL   Creatinine, Ser 0.74 0.44 - 1.00 mg/dL   Calcium 9.1 8.9 - 10.3 mg/dL   Total Protein 7.4 6.5 - 8.1 g/dL   Albumin 3.9 3.5 - 5.0 g/dL   AST 16 15 - 41 U/L   ALT 21 0 - 44 U/L   Alkaline Phosphatase 51 38 - 126 U/L    Total Bilirubin 0.3 0.3 - 1.2 mg/dL   GFR, Estimated >60 >60 mL/min    Comment: (NOTE) Calculated using the CKD-EPI Creatinine Equation (2021)    Anion gap 11 5 - 15    Comment: Performed at [facility], [address], [place], [place] [address]  Magnesium     Status: None   Collection Time: 03/27/22  4:26 AM  Result Value Ref Range   Magnesium 2.1 1.7 - 2.4 mg/dL    Comment: Performed at [facility], [address], [place], [place] [address]  CBC with Differential/Platelet     Status: Abnormal   Collection Time: 03/27/22  4:26 AM  Result Value Ref Range   WBC 12.3 (H) 4.0 - 10.5 K/uL   RBC 4.56 3.87 - 5.11 MIL/uL   Hemoglobin 14.0 12.0 - 15.0 g/dL   HCT 41.3 36.0 - 46.0 %   MCV 90.6 80.0 - 100.0 fL   MCH 30.7 26.0 - 34.0 pg   MCHC 33.9 30.0 - 36.0 g/dL   RDW 12.4 11.5 - 15.5 %   Platelets 176 150 - 400 K/uL   nRBC 0.0 0.0 - 0.2 %   Neutrophils Relative % 87 %   Neutro Abs 10.6 (H) 1.7 - 7.7 K/uL   Lymphocytes Relative 7 %   Lymphs Abs 0.8 0.7 -  4.0 K/uL   Monocytes Relative 6 %   Monocytes Absolute 0.8 0.1 - 1.0 K/uL   Eosinophils Relative 0 %   Eosinophils Absolute 0.0 0.0 - 0.5 K/uL   Basophils Relative 0 %   Basophils Absolute 0.0 0.0 - 0.1 K/uL   Immature Granulocytes 0 %   Abs Immature Granulocytes 0.05 0.00 - 0.07 K/uL    Comment: Performed at [facility], [address] [place], [place] [address]  Hemoglobin A1c     Status: Abnormal   Collection Time: 03/27/22  4:26 AM  Result Value Ref Range   Hgb A1c MFr Bld 5.8 (H) 4.8 - 5.6 %    Comment: (NOTE) Pre diabetes:          5.7%-6.4%  Diabetes:              >6.4%  Glycemic control for   <7.0% adults with diabetes    Mean Plasma Glucose 119.76 mg/dL    Comment: Performed at [facility] [address], [place], [place] [address]  Troponin I (High Sensitivity)     Status: Abnormal   Collection Time: 03/27/22  4:45 AM  Result Value Ref Range   Troponin I (High Sensitivity) 41 (H) <18 ng/L     Comment: (NOTE) Elevated high sensitivity troponin I (hsTnI) values and significant  changes across serial measurements may suggest ACS but many other  chronic and acute conditions are known to elevate hsTnI results.  Refer to the "Links" section for chest pain algorithms and additional  guidance. Performed at [facility], [address] [place], [place] [address]     CT Abdomen Pelvis W Contrast  Result Date: 03/26/2022 CLINICAL DATA:  Upper abdominal pain, vomiting EXAM: CT ABDOMEN AND PELVIS WITH CONTRAST TECHNIQUE: Multidetector CT imaging of the abdomen and pelvis was performed using the standard protocol following bolus administration of intravenous contrast. RADIATION DOSE REDUCTION: This exam was performed according to the departmental dose-optimization program which includes automated exposure control, adjustment of the mA and/or kV according to patient size and/or use of iterative reconstruction technique. CONTRAST:  159m OMNIPAQUE IOHEXOL 300 MG/ML  SOLN COMPARISON:  None Available. FINDINGS: Lower chest: Lung bases are clear. Hepatobiliary: Liver is notable for geographic hepatic steatosis. Cholelithiasis with mild gallbladder wall edema and pericholecystic inflammatory changes (coronal image 36), suspicious for acute cholecystitis. No intrahepatic or extrahepatic duct dilatation. Pancreas: Within normal limits. Spleen: Within normal limits. Adrenals/Urinary Tract: Adrenal glands are within normal limits. Left renal sinus cysts. 17 mm right lower pole renal cyst (series 2/image 50) with two adjacent calculi measuring up to 4 mm. No hydronephrosis. Bladder is underdistended but unremarkable. Stomach/Bowel: Stomach is notable for a tiny hiatal hernia. No evidence of bowel obstruction. Normal appendix (series 2/image 67). Mild left colonic diverticulosis, without evidence of diverticulitis. Vascular/Lymphatic: No evidence of abdominal aortic aneurysm. Atherosclerotic calcifications of the  abdominal aorta and branch vessels. No suspicious abdominopelvic lymphadenopathy. Reproductive: Calcified uterine fibroids. Bilateral ovaries are within normal limits. Other: No abdominopelvic ascites. Tiny fat containing bilateral inguinal hernias (series 2/image 90). Musculoskeletal: Mild degenerative changes of the visualized thoracolumbar spine. IMPRESSION: Cholecystitis cholelithiasis with suspected acute cholecystitis. Additional ancillary findings as above. Electronically Signed   By: SJulian HyM.D.   On: 03/26/2022 23:41     Assessment & Plan:  Charlene HOLMESis a 71y.o. female who was admitted with acute cholecystitis.  CT abdomen pelvis demonstrating acute cholecystitis with cholelithiasis.  WBC 11.4.  Imaging and blood work  evaluated by myself  -I explained the pathophysiology of gallbladder disease and acute cholecystitis to the patient.  I explained that the recommended treatment at this time would be a laparoscopic cholecystectomy. -I counseled the patient about the indication, risks and benefits of laparoscopic cholecystectomy.  She understands there is a very small chance for bleeding, infection, injury to normal structures (including common bile duct), conversion to open surgery, persistent symptoms, evolution of postcholecystectomy diarrhea, need for secondary interventions, anesthesia reaction, cardiopulmonary issues and other risks not specifically detailed here. I described the expected recovery, the plan for follow-up and the restrictions during the recovery phase.  All questions were answered. -Plan for laparoscopic cholecystectomy this afternoon -Rocephin/Flagyl -NPO -IVF -Pain control and antiemetics -Further recommendations to follow surgery  All questions were answered to the satisfaction of the patient.  -- Graciella Freer, DO [facility] [address] [place], [place] [address] [phone] (office)

## 2022-03-27 NOTE — Assessment & Plan Note (Addendum)
-   Continue losartan and hydrochlorothiazide -BP was elevated on arrival, -Resuming home meds when tolerating p.o., continue as needed hydralazine

## 2022-03-27 NOTE — Hospital Course (Signed)
Charlene Thompson is a 71 y.o. female with medical history significant of basal cell carcinoma of the skin, hypertension, prediabetes, presents the ED with a chief complaint of abdominal pain.  The abdominal pain started 3 days ago.  It is waxed and waned since then.  Eating makes it worse.  Last meal was approximately 13 hours prior to admission.  Last bowel movement was at 5:30 PM on 29 May.  Patient denies any diarrhea, hematochezia, or melena.  Patient describes her abdominal pain as radiating across both sides from her epigastric region.  She has had nausea and vomiting.  Vomiting seems to be the only thing that relieves the pain temporarily.  Patient denies any fevers but reports that she has been clammy.  Patient has no other complaints at this time.   Patient does not smoke, does not drink alcohol, does not use illicit drugs.  She is vaccinated for COVID.  Patient is full code.   ED Temp 97.6, heart rate 59-75, respiratory rate 17-18, BP 174/90-207/113 WBC 11.4, hemoglobin 15.3, platelets 207 Chemistry is unremarkable aside from glucose 167 Lipase 26 Troponin 18, 42 CT shows cholecystitis EKG shows a heart rate of 71, sinus rhythm, QTc 457 Rocephin 2 g started in the ED, continued admission Dilaudid 0.5 mg given, Morphine 4 mg given, Zofran 4 mg given Normal saline bolus 500 mL UA is not indicative of UTI  CT Abd: IMPRESSION: Cholecystitis cholelithiasis with suspected acute cholecystitis.   Admission requested by general surgery for cholecystitis

## 2022-03-27 NOTE — Anesthesia Preprocedure Evaluation (Signed)
Anesthesia Evaluation  Patient identified by MRN, date of birth, ID band Patient awake    Reviewed: Allergy & Precautions, H&P , NPO status , Patient's Chart, lab work & pertinent test results, reviewed documented beta blocker date and time   Airway Mallampati: II  TM Distance: >3 FB Neck ROM: full    Dental no notable dental hx.    Pulmonary neg pulmonary ROS,    Pulmonary exam normal breath sounds clear to auscultation       Cardiovascular Exercise Tolerance: Good hypertension, negative cardio ROS   Rhythm:regular Rate:Normal     Neuro/Psych negative neurological ROS  negative psych ROS   GI/Hepatic negative GI ROS, Neg liver ROS,   Endo/Other  diabetes, Type 2  Renal/GU negative Renal ROS  negative genitourinary   Musculoskeletal   Abdominal   Peds  Hematology negative hematology ROS (+)   Anesthesia Other Findings Troponin flat and determined by hospitalist to no be ACS.  Reproductive/Obstetrics negative OB ROS                             Anesthesia Physical Anesthesia Plan  ASA: 3  Anesthesia Plan: General   Post-op Pain Management:    Induction:   PONV Risk Score and Plan: Propofol infusion  Airway Management Planned:   Additional Equipment:   Intra-op Plan:   Post-operative Plan:   Informed Consent: I have reviewed the patients History and Physical, chart, labs and discussed the procedure including the risks, benefits and alternatives for the proposed anesthesia with the patient or authorized representative who has indicated his/her understanding and acceptance.     Dental Advisory Given  Plan Discussed with: CRNA  Anesthesia Plan Comments:         Anesthesia Quick Evaluation

## 2022-03-27 NOTE — Assessment & Plan Note (Addendum)
-   History of prediabetes with last hemoglobin A1c being 1 year ago 6.1 -Hyperglycemic in the ED at 167 -Repeated hemoglobin A1c:  -Continue to monitor

## 2022-03-27 NOTE — Discharge Instructions (Signed)
Ambulatory Surgery Discharge Instructions  General Anesthesia or Sedation Do not drive or operate heavy machinery for 24 hours.  Do not consume alcohol, tranquilizers, sleeping medications, or any non-prescribed medications for 24 hours. Do not make important decisions or sign any important papers in the next 24 hours. You should have someone with you tonight at home.  Activity  You are advised to go directly home from the hospital.  Restrict your activities and rest for a day.  Resume light activity tomorrow. No heavy lifting over 10 lbs or strenuous exercise.  Fluids and Diet Begin with clear liquids, bouillon, dry toast, soda crackers.  If not nauseated, you may go to a regular diet when you desire.  Greasy and spicy foods are not advised.  Medications  If you have not had a bowel movement in 24 hours, take 2 tablespoons over the counter Milk of mag.             You May resume your blood thinners tomorrow (Aspirin, coumadin, or other).  You are being discharged with prescriptions for Opioid/Narcotic Medications: There are some specific considerations for these medications that you should know. Opioid Meds have risks & benefits. Addiction to these meds is always a concern with prolonged use Take medication only as directed Do not drive while taking narcotic pain medication Do not crush tablets or capsules Do not use a different container than medication was dispensed in Lock the container of medication in a cool, dry place out of reach of children and pets. Opioid medication can cause addiction Do not share with anyone else (this is a felony) Do not store medications for future use. Dispose of them properly.     Disposal:  Find a [place] household drug take back site near you.  If you can't get to a drug take back site, use the recipe below as a last resort to dispose of expired, unused or unwanted drugs. Disposal  (Do not dispose chemotherapy drugs this way, talk to your  prescribing doctor instead.) Step 1: Mix drugs (do not crush) with dirt, kitty litter, or used coffee grounds and add a small amount of water to dissolve any solid medications. Step 2: Seal drugs in plastic bag. Step 3: Place plastic bag in trash. Step 4: Take prescription container and scratch out personal information, then recycle or throw away.  Operative Site  You have a liquid bandage over your incisions, this will begin to flake off in about a week. Ok to shower tomorrow. Keep wound clean and dry. No baths or swimming. No lifting more than 10 pounds.  Contact Information: If you have questions or concerns, please call our office, [phone], Monday- Thursday 8AM-5PM and Friday 8AM-12Noon.  If it is after hours or on the weekend, please call [facility] Main Number, [phone], and ask to speak to the surgeon on call for Dr. Pappayliou at [facility].   SPECIFIC COMPLICATIONS TO WATCH FOR: Inability to urinate Fever over 101? F by mouth Nausea and vomiting lasting longer than 24 hours. Pain not relieved by medication ordered Swelling around the operative site Increased redness, warmth, hardness, around operative area Numbness, tingling, or cold fingers or toes Blood -soaked dressing, (small amounts of oozing may be normal) Increasing and progressive drainage from surgical area or exam site  

## 2022-03-27 NOTE — Anesthesia Procedure Notes (Signed)
Procedure Name: Intubation Date/Time: 03/27/2022 1:04 PM Performed by: Lieutenant Diego, CRNA Pre-anesthesia Checklist: Patient identified, Emergency Drugs available, Suction available and Patient being monitored Patient Re-evaluated:Patient Re-evaluated prior to induction Oxygen Delivery Method: Circle system utilized Preoxygenation: Pre-oxygenation with 100% oxygen Induction Type: IV induction Ventilation: Mask ventilation without difficulty Laryngoscope Size: Miller and 2 Grade View: Grade I Tube type: Oral Tube size: 7.0 mm Number of attempts: 1 Airway Equipment and Method: Stylet and Oral airway Placement Confirmation: ETT inserted through vocal cords under direct vision, positive ETCO2 and breath sounds checked- equal and bilateral Secured at: 21 cm Tube secured with: Tape Dental Injury: Teeth and Oropharynx as per pre-operative assessment

## 2022-03-27 NOTE — Assessment & Plan Note (Addendum)
-   Likely demand ischemia in the setting of hypoxia and hypertensive urgency -Continue oxygen supplementation -Cycle troponin, flat 42, 41...  -Patient is chest pain-free -EKG is without acute ischemic changes

## 2022-03-27 NOTE — Assessment & Plan Note (Addendum)
-  N.p.o. -Pain improved with IV analgesics -General surgery consult, pending possible laparoscopic cholecystectomy today 03/27/2022  - The scan shows cholecystitis with cholelithiasis  -Mild leukocytosis likely related to cholecystitis -Continue Rocephin, continue pain control -UA is not indicative of UTI -Treat nausea with Zofran >> switching to Unasyn -Continue to monitor  Patient is low risk for surgical intervention specifically laparoscopic cholecystectomy. Benefit outweighs the risk... Discussed with patient and her husband.  Patient is cleared for surgical intervention!

## 2022-03-27 NOTE — ED Provider Notes (Signed)
Patient signed out to me by Dr. Dina Rich.  Patient seen earlier tonight for upper abdominal pain with nausea and vomiting.  CT scan pending at time of signout.  CT scan reveals evidence of large gallstone with cholecystitis.  Discussed with Dr. Arnoldo Morale, will see patient in the morning.  Admit to hospitalist service.   Orpah Greek, MD 03/27/22 [phone]

## 2022-03-27 NOTE — Op Note (Signed)
Operative Note   Preoperative Diagnosis: Acute cholecystitis   Postoperative Diagnosis: Same   Procedure(s) Performed: Laparoscopic cholecystectomy   Surgeon: Graciella Freer, DO    Assistants: Marquita Palms, RN   Anesthesia: General endotracheal   Anesthesiologist: Dr. Briant Cedar  Specimens: Gallbladder    Estimated Blood Loss: Minimal    Blood Replacement: None    Complications: None    Indications: Patient is a 71 year old female who was admitted to the hospital with acute cholecystitis.  She underwent a CT abdomen and pelvis which demonstrated acute cholecystitis with cholelithiasis.  She is agreeable to laparoscopic cholecystectomy at this time.  All risks, benefits, and alternatives to laparoscopic cholecystectomy were discussed with the patient, all of her questions were answered to her expressed satisfaction. The patient expresses she wishes to proceed, and informed consent was obtained.  Operative Findings: Acute cholecystitis with very large cholelithiasis (one stone >4 cm)   Procedure: The patient was taken to the operating room and placed supine. General endotracheal anesthesia was induced. Intravenous antibiotics were administered per protocol. An orogastric tube positioned to decompress the stomach. The abdomen was prepared and draped in the usual sterile fashion.    A supraumbilical incision was made and a Veress technique was utilized to achieve pneumoperitoneum to 15 mmHg with carbon dioxide. A 11 mm optiview port was placed through the supraumbilical region, and a 10 mm 0-degree operative laparoscope was introduced. The area underlying the trocar and Veress needle were inspected and without evidence of injury.  Remaining trocars were placed under direct vision. Two 5 mm ports were placed in the right abdomen, between the anterior axillary and midclavicular line.  A final 11 mm port was placed through the mid-epigastrium, near the falciform ligament.    The  gallbladder was noted to be significantly distended and inflamed.  It was decompressed prior to the initiation of the dissection.  The gallbladder fundus was elevated cephalad and the infundibulum was retracted to the patient's right. The gallbladder/cystic duct junction was skeletonized. The cystic artery noted in the triangle of Calot and was also skeletonized.  We then continued liberal medial and lateral dissection until the critical view of safety was achieved.    The cystic artery was doubly clipped and divided.  Given the surrounding inflammatory tissue, decision was made to staple the cystic duct with Endo GIA stapler with vascular load.  The gallbladder was then dissected from the liver bed with electrocautery. The specimen was placed in an Endopouch and was retrieved through the epigastric site.   Final inspection revealed acceptable hemostasis.  Surgicel powder and surgical SNOW was placed in the gallbladder bed.  Trocars were removed and pneumoperitoneum was released.  0 Vicryl fascial sutures were used to close the epigastric and umbilical port sites. Skin incisions were closed with 4-0 Monocryl subcuticular sutures and Dermabond. The patient was awakened from anesthesia and extubated without complication.    Graciella Freer, DO  [facility] [address] [place], [place] [address] [phone] (office)

## 2022-03-27 NOTE — Assessment & Plan Note (Addendum)
-  O2 demand continues to improve - Patient requiring 4 L nasal cannula to maintain her oxygen saturations after being given a dose of Dilaudid and falling asleep -Patient has no history of requirement for oxygen supplementation -She does not feel short of breath or have chest pain -Troponin 18, and then 42 -EKG is without ST changes -This is most likely related to the Dilaudid -Patient satting 98% on 2 L of oxygen this morning

## 2022-03-27 NOTE — Progress Notes (Signed)
Pt returned to room from PACU s/p lap cholecystectomy. Pt drowsy but alert and oriented. C/o pain RUQ, describes as "soreness", worse with palpation and movement. Pt taking ice chips without n/v. IVF infusing without s/s infiltration. Surgical sites clean, dry with liquid skin adhesive intact, no drainage.  Bed alarm on for safety, call bell placed in pt's lap. Advised to call for needs and not to get up without staff assistance. Instructed pt on C&DB, importance of early ambulation for pain reduction, good pulmonary toilet and resumption of bowel function. Husband at bedside and both he and pt state understanding.

## 2022-03-27 NOTE — Progress Notes (Signed)
  Transition of Care [facility]) Screening Note   Patient Details  Name: Charlene Thompson Date of Birth: Dec 20, 1950   Transition of Care [facility]) CM/SW Contact:    Ihor Gully, LCSW Phone Number: 03/27/2022, 1:22 PM    Transition of Care Department [facility]) has reviewed patient and no TOC needs have been identified at this time. We will continue to monitor patient advancement through interdisciplinary progression rounds. If new patient transition needs arise, please place a TOC consult.

## 2022-03-27 NOTE — Progress Notes (Signed)
Update note:  Spoke with the patient's husband in the consultation room.  I explained that we were able to remove her gallbladder without significant issue.  She did have a very large stone in her gallbladder, which required her epigastric incision to be enlarged.  She tolerated the procedure without issue.  She will be transferred back to her room on the floor, and we will plan to get blood work tomorrow.  I will give her a regular diet, but she should take it easy and eat bland foods first given her recent anesthesia.  She has dissolvable stitches under the skin and overlying skin glue, which will flake off in 10 to 14 days.  All questions were answered to his expressed satisfaction.  Plan: -Regular diet -Continue Rocephin and Flagyl given amount of inflammation, we will not be discharged home with antibiotics -Pain control and antiemetics as needed -Scheduled Tylenol ordered -Blood work tomorrow morning -Possible discharge in the next 24 to 48 hours pending tolerance of diet and adequate pain control with oral medications  Graciella Freer, DO [facility] [address] [place], [place] [address] [phone] (office)

## 2022-03-27 NOTE — Progress Notes (Signed)
PROGRESS NOTE    Patient: Charlene Thompson                            PCP: Kathyrn Drown, MD                    DOB: 02/22/1951            DOA: 03/26/2022 JSH:[id number]             DOS: 03/27/2022, 12:02 PM   LOS: 0 days   Date of Service: The patient was seen and examined on 03/27/2022  Subjective:   The patient was seen and examined this morning. Stable at this time. Still complaining of : Abdominal pain but improved with IV and pain medication Otherwise no issues overnight .  Brief Narrative:    AVAGAIL Thompson is a 71 y.o. female with medical history significant of basal cell carcinoma of the skin, hypertension, prediabetes, presents the ED with a chief complaint of abdominal pain.  The abdominal pain started 3 days ago.  It is waxed and waned since then.  Eating makes it worse.  Last meal was approximately 13 hours prior to admission.  Last bowel movement was at 5:30 PM on 29 May.  Patient denies any diarrhea, hematochezia, or melena.  Patient describes her abdominal pain as radiating across both sides from her epigastric region.  She has had nausea and vomiting.  Vomiting seems to be the only thing that relieves the pain temporarily.  Patient denies any fevers but reports that she has been clammy.  Patient has no other complaints at this time.   Patient does not smoke, does not drink alcohol, does not use illicit drugs.  She is vaccinated for COVID.  Patient is full code.   ED Temp 97.6, heart rate 59-75, respiratory rate 17-18, BP 174/90-207/113 WBC 11.4, hemoglobin 15.3, platelets 207 Chemistry is unremarkable aside from glucose 167 Lipase 26 Troponin 18, 42 CT shows cholecystitis EKG shows a heart rate of 71, sinus rhythm, QTc 457 Rocephin 2 g started in the ED, continued admission Dilaudid 0.5 mg given, Morphine 4 mg given, Zofran 4 mg given Normal saline bolus 500 mL UA is not indicative of UTI  CT Abd: IMPRESSION: Cholecystitis cholelithiasis with suspected acute  cholecystitis.   Admission requested by general surgery for cholecystitis    Assessment & Plan:   Principal Problem:   Cholecystitis Active Problems:   Hypertension   Prediabetes   Acute respiratory failure with hypoxia (HCC)   Elevated troponin     Assessment and Plan: * Cholecystitis -N.p.o. -Pain improved with IV analgesics -General surgery consult, pending possible laparoscopic cholecystectomy today 03/27/2022  - The scan shows cholecystitis with cholelithiasis  -Mild leukocytosis likely related to cholecystitis -Continue Rocephin, continue pain control -UA is not indicative of UTI -Treat nausea with Zofran >> switching to Unasyn -Continue to monitor  Patient is low risk for surgical intervention specifically laparoscopic cholecystectomy. Benefit outweighs the risk... Discussed with patient and her husband.  Patient is cleared for surgical intervention!   Elevated troponin - Likely demand ischemia in the setting of hypoxia and hypertensive urgency -Continue oxygen supplementation -Cycle troponin, flat 42, 41...  -Patient is chest pain-free -EKG is without acute ischemic changes   Acute respiratory failure with hypoxia (HCC) -O2 demand continues to improve - Patient requiring 4 L nasal cannula to maintain her oxygen saturations after being given a dose of Dilaudid and  falling asleep -Patient has no history of requirement for oxygen supplementation -She does not feel short of breath or have chest pain -Troponin 18, and then 42 -EKG is without ST changes -This is most likely related to the Dilaudid -Patient satting 98% on 2 L of oxygen this morning  Prediabetes - History of prediabetes with last hemoglobin A1c being 1 year ago 6.1 -Hyperglycemic in the ED at 167 -Repeated hemoglobin A1c:  -Continue to monitor  Hypertension - Continue losartan and hydrochlorothiazide -BP was elevated on arrival, -Resuming home meds when tolerating p.o., continue as  needed hydralazine    -----------------------------------------------------------------------------------------------------------------------------------------  DVT prophylaxis:  heparin injection 5,000 Units Start: 03/27/22 0600 SCDs Start: 03/27/22 0200   Code Status:   Code Status: Full Code  Family Communication: Discussed with patient and her husband at bedside  the above findings and plan of care has been discussed with patient   in detail,  they expressed understanding and agreement of above. -Advance care planning has been discussed.   Admission status:   Status is: Inpatient Remains inpatient appropriate because: We will continue to be n.p.o., IV fluids, duration, needing surgical intervention     Procedures:   No admission procedures for hospital encounter.   Antimicrobials:  Anti-infectives (From admission, onward)    Start     Dose/Rate Route Frequency Ordered Stop   03/28/22 0000  [MAR Hold]  cefTRIAXone (ROCEPHIN) 2 g in sodium chloride 0.9 % 100 mL IVPB        (MAR Hold since Tue 03/27/2022 at 1118.Hold Reason: Transfer to a Procedural area)   2 g 200 mL/hr over 30 Minutes Intravenous Every 24 hours 03/27/22 0442     03/27/22 0000  cefTRIAXone (ROCEPHIN) 2 g in sodium chloride 0.9 % 100 mL IVPB        2 g 200 mL/hr over 30 Minutes Intravenous  Once 03/26/22 2355 03/27/22 0059        Medication:   [MAR Hold] heparin  5,000 Units Subcutaneous Q8H   [MAR Hold] losartan  100 mg Oral Daily   And   [MAR Hold] hydrochlorothiazide  25 mg Oral Daily    [MAR Hold] acetaminophen **OR** [MAR Hold] acetaminophen, [MAR Hold] hydrALAZINE, [MAR Hold]  HYDROmorphone (DILAUDID) injection, [MAR Hold]  morphine injection, [MAR Hold] ondansetron **OR** [MAR Hold] ondansetron (ZOFRAN) IV, [MAR Hold] oxyCODONE   Objective:   Vitals:   03/27/22 0100 03/27/22 0203 03/27/22 0300 03/27/22 0605  BP: (!) 190/87 (!) 161/62  (!) 155/82  Pulse: 60 72  81  Resp: '20 18  18   '$ Temp: 98.1 F (36.7 C) 98 F (36.7 C)  98.2 F (36.8 C)  TempSrc: Oral Oral    SpO2: 99% 100%  98%  Weight:   87.2 kg   Height:   '5\' 6"'$  (1.676 m)     Intake/Output Summary (Last 24 hours) at 03/27/2022 1202 Last data filed at 03/27/2022 0059 Gross per 24 hour  Intake 600 ml  Output --  Net 600 ml   Filed Weights   03/26/22 1821 03/27/22 0300  Weight: 87.3 kg 87.2 kg     Examination:   Physical Exam  Constitution:  Alert, cooperative, no distress,  Appears calm and comfortable  Psychiatric:   Normal and stable mood and affect, cognition intact,   HEENT:        Normocephalic, PERRL, otherwise with in Normal limits  Chest:         Chest symmetric Cardio vascular:  S1/S2, RRR, No  murmure, No Rubs or Gallops  pulmonary: Clear to auscultation bilaterally, respirations unlabored, negative wheezes / crackles Abdomen: Right upper quadrant abdominal pain, otherwise  soft,  non-distended, bowel sounds,no masses, no organomegaly Muscular skeletal: Limited exam - in bed, able to move all 4 extremities,   Neuro: CNII-XII intact. , normal motor and sensation, reflexes intact  Extremities: No pitting edema lower extremities, +2 pulses  Skin: Dry, warm to touch, negative for any Rashes, No open wounds Wounds: per nursing documentation   ------------------------------------------------------------------------------------------------------------------------------------------    LABs:     Latest Ref Rng & Units 03/27/2022    4:26 AM 03/26/2022    6:56 PM 03/13/2021   10:43 AM  CBC  WBC 4.0 - 10.5 K/uL 12.3   11.4   4.8    Hemoglobin 12.0 - 15.0 g/dL 14.0   15.3   14.7    Hematocrit 36.0 - 46.0 % 41.3   45.4   43.2    Platelets 150 - 400 K/uL 176   207   194        Latest Ref Rng & Units 03/27/2022    4:26 AM 03/26/2022    6:56 PM 03/13/2021   10:43 AM  CMP  Glucose 70 - 99 mg/dL 148   167   84    BUN 8 - 23 mg/dL '17   18   15    '$ Creatinine 0.44 - 1.00 mg/dL 0.74   1.09   1.05     Sodium 135 - 145 mmol/L 141   137   141    Potassium 3.5 - 5.1 mmol/L 3.4   3.5   4.0    Chloride 98 - 111 mmol/L 105   101   102    CO2 22 - 32 mmol/L '25   28   22    '$ Calcium 8.9 - 10.3 mg/dL 9.1   9.9   10.2    Total Protein 6.5 - 8.1 g/dL 7.4   8.5   6.8    Total Bilirubin 0.3 - 1.2 mg/dL 0.3   0.7   0.3    Alkaline Phos 38 - 126 U/L 51   60   57    AST 15 - 41 U/L '16   21   25    '$ ALT 0 - 44 U/L '21   27   30         '$ Micro Results No results found for this or any previous visit (from the past 240 hour(s)).  Radiology Reports CT Abdomen Pelvis W Contrast  Result Date: 03/26/2022 CLINICAL DATA:  Upper abdominal pain, vomiting EXAM: CT ABDOMEN AND PELVIS WITH CONTRAST TECHNIQUE: Multidetector CT imaging of the abdomen and pelvis was performed using the standard protocol following bolus administration of intravenous contrast. RADIATION DOSE REDUCTION: This exam was performed according to the departmental dose-optimization program which includes automated exposure control, adjustment of the mA and/or kV according to patient size and/or use of iterative reconstruction technique. CONTRAST:  130m OMNIPAQUE IOHEXOL 300 MG/ML  SOLN COMPARISON:  None Available. FINDINGS: Lower chest: Lung bases are clear. Hepatobiliary: Liver is notable for geographic hepatic steatosis. Cholelithiasis with mild gallbladder wall edema and pericholecystic inflammatory changes (coronal image 36), suspicious for acute cholecystitis. No intrahepatic or extrahepatic duct dilatation. Pancreas: Within normal limits. Spleen: Within normal limits. Adrenals/Urinary Tract: Adrenal glands are within normal limits. Left renal sinus cysts. 17 mm right lower pole renal cyst (series 2/image 50) with two adjacent calculi measuring up to 4 mm.  No hydronephrosis. Bladder is underdistended but unremarkable. Stomach/Bowel: Stomach is notable for a tiny hiatal hernia. No evidence of bowel obstruction. Normal appendix (series 2/image 67).  Mild left colonic diverticulosis, without evidence of diverticulitis. Vascular/Lymphatic: No evidence of abdominal aortic aneurysm. Atherosclerotic calcifications of the abdominal aorta and branch vessels. No suspicious abdominopelvic lymphadenopathy. Reproductive: Calcified uterine fibroids. Bilateral ovaries are within normal limits. Other: No abdominopelvic ascites. Tiny fat containing bilateral inguinal hernias (series 2/image 90). Musculoskeletal: Mild degenerative changes of the visualized thoracolumbar spine. IMPRESSION: Cholecystitis cholelithiasis with suspected acute cholecystitis. Additional ancillary findings as above. Electronically Signed   By: Julian Hy M.D.   On: 03/26/2022 23:41    SIGNED: Deatra James, MD, FHM. [facility],  Pager (please use [email or web address] to page/text) Please use Epic Secure Chat for non-urgent communication (7AM-7PM)  If 7PM-7AM, please contact night-coverage [email or web address], 03/27/2022, 12:02 PM

## 2022-03-27 NOTE — Transfer of Care (Signed)
Immediate Anesthesia Transfer of Care Note  Patient: Charlene Thompson  Procedure(s) Performed: LAPAROSCOPIC CHOLECYSTECTOMY (Abdomen)  Patient Location: PACU  Anesthesia Type:General  Level of Consciousness: oriented and drowsy  Airway & Oxygen Therapy: Patient Spontanous Breathing  Post-op Assessment: Report given to RN and Post -op Vital signs reviewed and stable  Post vital signs: Reviewed and stable  Last Vitals:  Vitals Value Taken Time  BP    Temp    Pulse 92 03/27/22 1543  Resp 15 03/27/22 1543  SpO2 97 % 03/27/22 1543  Vitals shown include unvalidated device data.  Last Pain:  Vitals:   03/27/22 0852  TempSrc:   PainSc: 3          Complications: No notable events documented.

## 2022-03-28 ENCOUNTER — Encounter (HOSPITAL_COMMUNITY): Payer: Self-pay | Admitting: Surgery

## 2022-03-28 DIAGNOSIS — K810 Acute cholecystitis: Secondary | ICD-10-CM | POA: Diagnosis not present

## 2022-03-28 LAB — COMPREHENSIVE METABOLIC PANEL
ALT: 44 U/L (ref 0–44)
AST: 36 U/L (ref 15–41)
Albumin: 2.8 g/dL — ABNORMAL LOW (ref 3.5–5.0)
Alkaline Phosphatase: 38 U/L (ref 38–126)
Anion gap: 4 — ABNORMAL LOW (ref 5–15)
BUN: 23 mg/dL (ref 8–23)
CO2: 30 mmol/L (ref 22–32)
Calcium: 8.2 mg/dL — ABNORMAL LOW (ref 8.9–10.3)
Chloride: 105 mmol/L (ref 98–111)
Creatinine, Ser: 0.89 mg/dL (ref 0.44–1.00)
GFR, Estimated: >60 mL/min (ref >60)
Glucose, Bld: 128 mg/dL — ABNORMAL HIGH (ref 70–99)
Potassium: 3.4 mmol/L — ABNORMAL LOW (ref 3.5–5.1)
Sodium: 139 mmol/L (ref 135–145)
Total Bilirubin: 0.2 mg/dL — ABNORMAL LOW (ref 0.3–1.2)
Total Protein: 5.6 g/dL — ABNORMAL LOW (ref 6.5–8.1)

## 2022-03-28 LAB — CBC
HCT: 33.4 % — ABNORMAL LOW (ref 36.0–46.0)
Hemoglobin: 11 g/dL — ABNORMAL LOW (ref 12.0–15.0)
MCH: 30.7 pg (ref 26.0–34.0)
MCHC: 32.9 g/dL (ref 30.0–36.0)
MCV: 93.3 fL (ref 80.0–100.0)
Platelets: 157 10*3/uL (ref 150–400)
RBC: 3.58 MIL/uL — ABNORMAL LOW (ref 3.87–5.11)
RDW: 12.6 % (ref 11.5–15.5)
WBC: 12.1 10*3/uL — ABNORMAL HIGH (ref 4.0–10.5)
nRBC: 0 % (ref 0.0–0.2)

## 2022-03-28 MED ORDER — OXYCODONE HCL 5 MG PO TABS
5.0000 mg | ORAL_TABLET | ORAL | 0 refills | Status: DC | PRN
Start: 2022-03-28 — End: 2022-04-06

## 2022-03-28 MED ORDER — POTASSIUM CHLORIDE CRYS ER 20 MEQ PO TBCR
40.0000 meq | EXTENDED_RELEASE_TABLET | Freq: Once | ORAL | Status: AC
  Administered 2022-03-28: 40 meq via ORAL
  Filled 2022-03-28: qty 2

## 2022-03-28 NOTE — Progress Notes (Signed)
[facility] Progress Note  1 Day Post-Op  Subjective: Patient seen and examined.  She is resting comfortably in bed.  She has some soreness in her upper abdomen, but her pain is much better than it was yesterday.  She was able to tolerate her diet without nausea or vomiting.  She confirms passing flatus without any bowel movements yet.  She denies fevers and chills.  Objective: Vital signs in last 24 hours: Temp:  [97.8 F (36.6 C)-99.4 F (37.4 C)] 97.8 F (36.6 C) (05/31 0506) Pulse Rate:  [68-92] 75 (05/31 0506) Resp:  [8-19] 18 (05/31 0506) BP: (103-140)/(58-78) 103/60 (05/31 0506) SpO2:  [89 %-100 %] 94 % (05/31 0506) Last BM Date : 03/26/22  Intake/Output from previous day: 05/30 0701 - 05/31 0700 In: 2873.1 [P.O.:740; I.V.:1824.3; IV Piggyback:308.8] Out: 100 [Blood:100] Intake/Output this shift: No intake/output data recorded.  General appearance: alert, cooperative, and no distress GI: Abdomen soft, nondistended, no percussion tenderness, minimal incisional tenderness to palpation; no rigidity, guarding, rebound tenderness; incisions C/D/I with Dermabond in place, mild ecchymosis at the epigastric incision  Lab Results:  Recent Labs    03/27/22 0426 03/28/22 0445  WBC 12.3* 12.1*  HGB 14.0 11.0*  HCT 41.3 33.4*  PLT 176 157   BMET Recent Labs    03/27/22 0426 03/28/22 0445  NA 141 139  K 3.4* 3.4*  CL 105 105  CO2 25 30  GLUCOSE 148* 128*  BUN 17 23  CREATININE 0.74 0.89  CALCIUM 9.1 8.2*   PT/INR No results for input(s): LABPROT, INR in the last 72 hours.  Studies/Results: CT Abdomen Pelvis W Contrast  Result Date: 03/26/2022 CLINICAL DATA:  Upper abdominal pain, vomiting EXAM: CT ABDOMEN AND PELVIS WITH CONTRAST TECHNIQUE: Multidetector CT imaging of the abdomen and pelvis was performed using the standard protocol following bolus administration of intravenous contrast. RADIATION DOSE REDUCTION: This exam was performed according  to the departmental dose-optimization program which includes automated exposure control, adjustment of the mA and/or kV according to patient size and/or use of iterative reconstruction technique. CONTRAST:  128m OMNIPAQUE IOHEXOL 300 MG/ML  SOLN COMPARISON:  None Available. FINDINGS: Lower chest: Lung bases are clear. Hepatobiliary: Liver is notable for geographic hepatic steatosis. Cholelithiasis with mild gallbladder wall edema and pericholecystic inflammatory changes (coronal image 36), suspicious for acute cholecystitis. No intrahepatic or extrahepatic duct dilatation. Pancreas: Within normal limits. Spleen: Within normal limits. Adrenals/Urinary Tract: Adrenal glands are within normal limits. Left renal sinus cysts. 17 mm right lower pole renal cyst (series 2/image 50) with two adjacent calculi measuring up to 4 mm. No hydronephrosis. Bladder is underdistended but unremarkable. Stomach/Bowel: Stomach is notable for a tiny hiatal hernia. No evidence of bowel obstruction. Normal appendix (series 2/image 67). Mild left colonic diverticulosis, without evidence of diverticulitis. Vascular/Lymphatic: No evidence of abdominal aortic aneurysm. Atherosclerotic calcifications of the abdominal aorta and branch vessels. No suspicious abdominopelvic lymphadenopathy. Reproductive: Calcified uterine fibroids. Bilateral ovaries are within normal limits. Other: No abdominopelvic ascites. Tiny fat containing bilateral inguinal hernias (series 2/image 90). Musculoskeletal: Mild degenerative changes of the visualized thoracolumbar spine. IMPRESSION: Cholecystitis cholelithiasis with suspected acute cholecystitis. Additional ancillary findings as above. Electronically Signed   By: SJulian HyM.D.   On: 03/26/2022 23:41    Anti-infectives: Anti-infectives (From admission, onward)    Start     Dose/Rate Route Frequency Ordered Stop   03/28/22 0000  cefTRIAXone (ROCEPHIN) 2 g in sodium chloride 0.9 % 100 mL IVPB  2 g 200 mL/hr over 30 Minutes Intravenous Every 24 hours 03/27/22 0442     03/27/22 1230  metroNIDAZOLE (FLAGYL) IVPB 500 mg        500 mg 100 mL/hr over 60 Minutes Intravenous Every 8 hours 03/27/22 1215     03/27/22 1226  sodium chloride 0.9 % with cefTRIAXone (ROCEPHIN) ADS Med       Note to Pharmacy: Rushie Chestnut J: cabinet override      03/27/22 1226 03/28/22 0029   03/27/22 1224  metroNIDAZOLE (FLAGYL) 500 MG/100ML IVPB       Note to Pharmacy: Rushie Chestnut J: cabinet override      03/27/22 1224 03/27/22 2028   03/27/22 0000  cefTRIAXone (ROCEPHIN) 2 g in sodium chloride 0.9 % 100 mL IVPB        2 g 200 mL/hr over 30 Minutes Intravenous  Once 03/26/22 2355 03/27/22 0059       Assessment/Plan:  Patient is a 71 year old female who was admitted with acute cholecystitis.  Status post laparoscopic cholecystectomy on 5/30.  -Leukocytosis slightly improved this morning, 12.1 from 12.3.  Suspect she has a mild reactive component. -Tolerating regular diet -Pain controlled with oral pain medications -Hep-Lock IV fluids -LFTs within normal limits -Patient stable for discharge from general surgery standpoint.  She should be discharged home with Roxicodone to take as needed for pain -Patient to follow-up with me in 2 to 3 weeks   LOS: 1 day    [place] 03/28/2022

## 2022-03-28 NOTE — Discharge Summary (Signed)
Physician Discharge Summary  Charlene Thompson:[id number] DOB: 03/29/1951 DOA: 03/26/2022  PCP: Kathyrn Drown, MD  Admit date: 03/26/2022  Discharge date: 03/28/2022  Admitted From:Home  Disposition:  Home  Recommendations for Outpatient Follow-up:  Follow up with PCP in 1-2 weeks Follow-up with general surgery in 2-3 weeks which will be scheduled Patient given prescription for oxycodone as needed for severe pain symptoms with 10 tablets and 0 refills No further need for antibiotics Continue other home medications as prior  Home Health: None  Equipment/Devices: None  Discharge Condition:Stable  CODE STATUS: Full  Diet recommendation: Heart Healthy  Brief/Interim Summary: Charlene Thompson is a 71 y.o. female with medical history significant of basal cell carcinoma of the skin, hypertension, prediabetes, presents the ED with a chief complaint of abdominal pain.  She was diagnosed with acute cholecystitis/cholelithiasis and had undergone laparoscopic cholecystectomy on 5/30.  She was maintained on Rocephin and Flagyl while admitted.  She had tolerated her procedure well with no significant complications noted and she continues to have some mild reactive leukocytosis.  She is tolerating diet and is passing flatus and has been evaluated by general surgery with recommendations for discharge and follow-up in the outpatient setting in the next 2-3 weeks.  No other acute events or concerns noted and she is stable for discharge.  Discharge Diagnoses:  Principal Problem:   Cholecystitis, acute Active Problems:   Hypertension   Prediabetes   Acute respiratory failure with hypoxia (HCC)   Elevated troponin  Principal discharge diagnosis: Acute cholecystitis with cholelithiasis status post laparoscopic cholecystectomy.  Discharge Instructions  Discharge Instructions     Diet - low sodium heart healthy   Complete by: As directed    If the dressing is still on your incision site when  you go home, remove it on the third day after your surgery date. Remove dressing if it begins to fall off, or if it is dirty or damaged before the third day.   Complete by: As directed    Increase activity slowly   Complete by: As directed       Allergies as of 03/28/2022       Reactions   Valium [diazepam]    Severe headache         Medication List     STOP taking these medications    clobetasol 0.05 % external solution Commonly known as: TEMOVATE   clobetasol cream 0.05 % Commonly known as: TEMOVATE   minocycline 50 MG capsule Commonly known as: MINOCIN       TAKE these medications    losartan-hydrochlorothiazide 100-25 MG tablet Commonly known as: HYZAAR TAKE (1) TABLET BY MOUTH ONCE DAILY.   oxyCODONE 5 MG immediate release tablet Commonly known as: Oxy IR/ROXICODONE Take 1 tablet (5 mg total) by mouth every 4 (four) hours as needed for moderate pain, severe pain or breakthrough pain.               Discharge Care Instructions  (From admission, onward)           Start     Ordered   03/28/22 0000  If the dressing is still on your incision site when you go home, remove it on the third day after your surgery date. Remove dressing if it begins to fall off, or if it is dirty or damaged before the third day.        03/28/22 1033            Follow-up Information  Pappayliou, Flint Melter, DO. Call.   Specialty: General Surgery Why: Call to schedule a follow up appointment with me in 3 weeks Contact information: [address] [place] [address] [phone]         Kathyrn Drown, MD. Schedule an appointment as soon as possible for a visit in 1 week(s).   Specialty: Family Medicine Contact information: [place] [address] [phone]                Allergies  Allergen Reactions   Valium [Diazepam]     Severe headache     Consultations: General surgery   Procedures/Studies: CT  Abdomen Pelvis W Contrast  Result Date: 03/26/2022 CLINICAL DATA:  Upper abdominal pain, vomiting EXAM: CT ABDOMEN AND PELVIS WITH CONTRAST TECHNIQUE: Multidetector CT imaging of the abdomen and pelvis was performed using the standard protocol following bolus administration of intravenous contrast. RADIATION DOSE REDUCTION: This exam was performed according to the departmental dose-optimization program which includes automated exposure control, adjustment of the mA and/or kV according to patient size and/or use of iterative reconstruction technique. CONTRAST:  161m OMNIPAQUE IOHEXOL 300 MG/ML  SOLN COMPARISON:  None Available. FINDINGS: Lower chest: Lung bases are clear. Hepatobiliary: Liver is notable for geographic hepatic steatosis. Cholelithiasis with mild gallbladder wall edema and pericholecystic inflammatory changes (coronal image 36), suspicious for acute cholecystitis. No intrahepatic or extrahepatic duct dilatation. Pancreas: Within normal limits. Spleen: Within normal limits. Adrenals/Urinary Tract: Adrenal glands are within normal limits. Left renal sinus cysts. 17 mm right lower pole renal cyst (series 2/image 50) with two adjacent calculi measuring up to 4 mm. No hydronephrosis. Bladder is underdistended but unremarkable. Stomach/Bowel: Stomach is notable for a tiny hiatal hernia. No evidence of bowel obstruction. Normal appendix (series 2/image 67). Mild left colonic diverticulosis, without evidence of diverticulitis. Vascular/Lymphatic: No evidence of abdominal aortic aneurysm. Atherosclerotic calcifications of the abdominal aorta and branch vessels. No suspicious abdominopelvic lymphadenopathy. Reproductive: Calcified uterine fibroids. Bilateral ovaries are within normal limits. Other: No abdominopelvic ascites. Tiny fat containing bilateral inguinal hernias (series 2/image 90). Musculoskeletal: Mild degenerative changes of the visualized thoracolumbar spine. IMPRESSION: Cholecystitis  cholelithiasis with suspected acute cholecystitis. Additional ancillary findings as above. Electronically Signed   By: SJulian HyM.D.   On: 03/26/2022 23:41     Discharge Exam: Vitals:   03/27/22 2355 03/28/22 0506  BP: 122/70 103/60  Pulse: 88 75  Resp: 16 18  Temp: 99.4 F (37.4 C) 97.8 F (36.6 C)  SpO2: 94% 94%   Vitals:   03/27/22 1630 03/27/22 2058 03/27/22 2355 03/28/22 0506  BP: 128/71 (!) 110/58 122/70 103/60  Pulse:  75 88 75  Resp: '10 18 16 18  '$ Temp: 98 F (36.7 C) 98.3 F (36.8 C) 99.4 F (37.4 C) 97.8 F (36.6 C)  TempSrc:  Oral Oral Oral  SpO2: 92% (!) 89% 94% 94%  Weight:      Height:        General: Pt is alert, awake, not in acute distress Cardiovascular: RRR, S1/S2 +, no rubs, no gallops Respiratory: CTA bilaterally, no wheezing, no rhonchi Abdominal: Soft, NT, ND, bowel sounds +, incisions clean dry and intact Extremities: no edema, no cyanosis    The results of significant diagnostics from this hospitalization (including imaging, microbiology, ancillary and laboratory) are listed below for reference.     Microbiology: No results found for this or any previous visit (from the past 240 hour(s)).   Labs: BNP (last 3 results)  No results for input(s): BNP in the last 8760 hours. Basic Metabolic Panel: Recent Labs  Lab 03/26/22 1856 03/27/22 0426 03/28/22 0445  NA 137 141 139  K 3.5 3.4* 3.4*  CL 101 105 105  CO2 '28 25 30  '$ GLUCOSE 167* 148* 128*  BUN '18 17 23  '$ CREATININE 1.09* 0.74 0.89  CALCIUM 9.9 9.1 8.2*  MG  --  2.1  --    Liver Function Tests: Recent Labs  Lab 03/26/22 1856 03/27/22 0426 03/28/22 0445  AST 21 16 36  ALT 27 21 44  ALKPHOS 60 51 38  BILITOT 0.7 0.3 0.2*  PROT 8.5* 7.4 5.6*  ALBUMIN 4.7 3.9 2.8*   Recent Labs  Lab 03/26/22 1856  LIPASE 26   No results for input(s): AMMONIA in the last 168 hours. CBC: Recent Labs  Lab 03/26/22 1856 03/27/22 0426 03/28/22 0445  WBC 11.4* 12.3* 12.1*   NEUTROABS  --  10.6*  --   HGB 15.3* 14.0 11.0*  HCT 45.4 41.3 33.4*  MCV 90.8 90.6 93.3  PLT 207 176 157   Cardiac Enzymes: No results for input(s): CKTOTAL, CKMB, CKMBINDEX, TROPONINI in the last 168 hours. BNP: Invalid input(s): POCBNP CBG: No results for input(s): GLUCAP in the last 168 hours. D-Dimer No results for input(s): DDIMER in the last 72 hours. Hgb A1c Recent Labs    03/27/22 0426  HGBA1C 5.8*   Lipid Profile No results for input(s): CHOL, HDL, LDLCALC, TRIG, CHOLHDL, LDLDIRECT in the last 72 hours. Thyroid function studies No results for input(s): TSH, T4TOTAL, T3FREE, THYROIDAB in the last 72 hours.  Invalid input(s): FREET3 Anemia work up No results for input(s): VITAMINB12, FOLATE, FERRITIN, TIBC, IRON, RETICCTPCT in the last 72 hours. Urinalysis    Component Value Date/Time   COLORURINE YELLOW 03/26/2022 2145   APPEARANCEUR CLEAR 03/26/2022 2145   LABSPEC 1.021 03/26/2022 2145   PHURINE 5.0 03/26/2022 2145   GLUCOSEU 50 (A) 03/26/2022 2145   HGBUR NEGATIVE 03/26/2022 2145   BILIRUBINUR NEGATIVE 03/26/2022 2145   KETONESUR 20 (A) 03/26/2022 2145   PROTEINUR 100 (A) 03/26/2022 2145   NITRITE NEGATIVE 03/26/2022 2145   LEUKOCYTESUR NEGATIVE 03/26/2022 2145   Sepsis Labs Invalid input(s): PROCALCITONIN,  WBC,  LACTICIDVEN Microbiology No results found for this or any previous visit (from the past 240 hour(s)).   Time coordinating discharge: 35 minutes  SIGNED:   Rodena Goldmann, DO [facility] 03/28/2022, 10:35 AM  If 7PM-7AM, please contact night-coverage [email or web address]

## 2022-03-28 NOTE — Progress Notes (Signed)
Ng Discharge Note  Admit Date:  03/26/2022 Discharge date: 03/28/2022   Charlene Thompson to be D/C'd Home per MD order.  AVS completed. Patient/caregiver able to verbalize understanding.  Discharge Medication: Allergies as of 03/28/2022       Reactions   Valium [diazepam]    Severe headache         Medication List     STOP taking these medications    clobetasol 0.05 % external solution Commonly known as: TEMOVATE   clobetasol cream 0.05 % Commonly known as: TEMOVATE   minocycline 50 MG capsule Commonly known as: MINOCIN       TAKE these medications    losartan-hydrochlorothiazide 100-25 MG tablet Commonly known as: HYZAAR TAKE (1) TABLET BY MOUTH ONCE DAILY.   oxyCODONE 5 MG immediate release tablet Commonly known as: Oxy IR/ROXICODONE Take 1 tablet (5 mg total) by mouth every 4 (four) hours as needed for moderate pain, severe pain or breakthrough pain.               Discharge Care Instructions  (From admission, onward)           Start     Ordered   03/28/22 0000  If the dressing is still on your incision site when you go home, remove it on the third day after your surgery date. Remove dressing if it begins to fall off, or if it is dirty or damaged before the third day.        03/28/22 1033            Discharge Assessment: Vitals:   03/27/22 2355 03/28/22 0506  BP: 122/70 103/60  Pulse: 88 75  Resp: 16 18  Temp: 99.4 F (37.4 C) 97.8 F (36.6 C)  SpO2: 94% 94%   Skin clean, dry and intact without evidence of skin break down, no evidence of skin tears noted. IV catheter discontinued intact. Site without signs and symptoms of complications - no redness or edema noted at insertion site, patient denies c/o pain - only slight tenderness at site.  Dressing with slight pressure applied.  D/c Instructions-Education: Discharge instructions given to patient/family with verbalized understanding. D/c education completed with patient/family including  follow up instructions, medication list, d/c activities limitations if indicated, with other d/c instructions as indicated by MD - patient able to verbalize understanding, all questions fully answered. Patient instructed to return to ED, call 911, or call MD for any changes in condition.  Patient escorted via [place], and D/C home via private auto.  Tsosie Billing, LPN 3/70/4888 91:69 AM

## 2022-03-28 NOTE — Telephone Encounter (Signed)
Pt has follow up with Hoyle Sauer on 06/09

## 2022-03-28 NOTE — Anesthesia Postprocedure Evaluation (Signed)
Anesthesia Post Note  Patient: Charlene Thompson  Procedure(s) Performed: LAPAROSCOPIC CHOLECYSTECTOMY (Abdomen)  Patient location during evaluation: Phase II Anesthesia Type: General Level of consciousness: awake Pain management: pain level controlled Vital Signs Assessment: post-procedure vital signs reviewed and stable Respiratory status: spontaneous breathing and respiratory function stable Cardiovascular status: blood pressure returned to baseline and stable Postop Assessment: no headache and no apparent nausea or vomiting Anesthetic complications: no Comments: Late entry   No notable events documented.   Last Vitals:  Vitals:   03/27/22 2355 03/28/22 0506  BP: 122/70 103/60  Pulse: 88 75  Resp: 16 18  Temp: 37.4 C 36.6 C  SpO2: 94% 94%    Last Pain:  Vitals:   03/28/22 0506  TempSrc: Oral  PainSc:                  Louann Sjogren

## 2022-03-29 LAB — SURGICAL PATHOLOGY

## 2022-03-30 ENCOUNTER — Telehealth: Payer: Self-pay

## 2022-03-30 NOTE — Telephone Encounter (Signed)
Transition Care Management Follow-up Telephone Call Date of discharge and from where: 03/28/2022 [facility] How have you been since you were released from the hospital? Pt states she is doing okay, just sore. Any questions or concerns? No  Items Reviewed: Did the pt receive and understand the discharge instructions provided? Yes  Medications obtained and verified? Yes  Other? No  Any new allergies since your discharge? No  Dietary orders reviewed? Yes Do you have support at home? Yes   Home Care and Equipment/Supplies: Were home health services ordered? no If so, what is the name of the agency? N/A  Has the agency set up a time to come to the patient's home? not applicable Were any new equipment or medical supplies ordered?  No What is the name of the medical supply agency? N/A Were you able to get the supplies/equipment? not applicable Do you have any questions related to the use of the equipment or supplies? No  Functional Questionnaire: (I = Independent and D = Dependent) ADLs: I  Bathing/Dressing- I  Meal Prep- I  Eating- I  Maintaining continence- I  Transferring/Ambulation- I  Managing Meds- I  Follow up appointments reviewed:  PCP Hospital f/u appt confirmed? Yes  Scheduled to see Pearson Forster, NP on 01/04/2022 @ 1:40. [facility] f/u appt confirmed? Yes  Scheduled to see Dr. Okey Dupre in 3 weeks. Are transportation arrangements needed? No  If their condition worsens, is the pt aware to call PCP or go to the Emergency Dept.? Yes Was the patient provided with contact information for the PCP's office or ED? Yes Was to pt encouraged to call back with questions or concerns? Yes

## 2022-04-06 ENCOUNTER — Ambulatory Visit: Payer: Medicare Other | Admitting: Nurse Practitioner

## 2022-04-06 VITALS — BP 131/68 | HR 73 | Temp 97.3°F | Wt 188.6 lb

## 2022-04-06 DIAGNOSIS — I10 Essential (primary) hypertension: Secondary | ICD-10-CM | POA: Diagnosis not present

## 2022-04-06 DIAGNOSIS — Z79899 Other long term (current) drug therapy: Secondary | ICD-10-CM

## 2022-04-06 DIAGNOSIS — E876 Hypokalemia: Secondary | ICD-10-CM

## 2022-04-06 DIAGNOSIS — E785 Hyperlipidemia, unspecified: Secondary | ICD-10-CM | POA: Diagnosis not present

## 2022-04-06 MED ORDER — POTASSIUM CHLORIDE ER 10 MEQ PO TBCR
10.0000 meq | EXTENDED_RELEASE_TABLET | Freq: Every day | ORAL | 0 refills | Status: DC
Start: 2022-04-06 — End: 2024-07-11

## 2022-04-06 NOTE — Progress Notes (Unsigned)
Subjective:    Patient ID: Charlene Thompson, female    DOB: [birth year], 71 y.o.   MRN: [id number]  HPI  Patient here for follow up on blood pressure. Of note patient had a cholecystectomy 03/27/2022.  Has been doing very well since then. Adherent to medication regimen.  Overall healthy diet.  Tries to stay active. Of note she has a history of lichen sclerosis, has used her steroid cream a few times, states this has resolved at this time. States they gave her a large pill at the hospital which they had to break in half, thinks it may have been a potassium pill.  States she eats a lot of high potassium foods particularly bananas and is unsure why her potassium might be a little low.   Review of Systems  Constitutional:  Negative for fever.  Respiratory:  Negative for chest tightness and shortness of breath.   Cardiovascular:  Negative for chest pain.  Gastrointestinal:  Positive for abdominal pain. Negative for constipation, diarrhea, nausea and vomiting.       Objective:   Physical Exam Vitals and nursing note reviewed.  Constitutional:      General: She is not in acute distress. Cardiovascular:     Rate and Rhythm: Normal rate and regular rhythm.     Heart sounds: Normal heart sounds. No murmur heard. Pulmonary:     Effort: Pulmonary effort is normal.     Breath sounds: Normal breath sounds.  Abdominal:     General: There is no distension.     Palpations: There is no mass.     Tenderness: There is abdominal tenderness.     Comments: Minimal upper abdominal tenderness to deep palpation more so on the right area of the abdomen.  Mild ecchymotic area noted near the umbilicus with a healing surgical wound.  Neurological:     Mental Status: She is alert.  Psychiatric:        Mood and Affect: Mood normal.        Behavior: Behavior normal.        Thought Content: Thought content normal.        Judgment: Judgment normal.     The 10-year ASCVD risk score (Arnett DK, et al., 2019)  is: 18%   Values used to calculate the score:     Age: 28 years     Sex: Female     Is Non-Hispanic African American: No     Diabetic: No     Tobacco smoker: No     Systolic Blood Pressure: 650 mmHg     Is BP treated: Yes     HDL Cholesterol: 59 mg/dL     Total Cholesterol: 208 mg/dL  Labs from hospital dated 03/28/2022: Potassium persistently 3.4.  A1c improved at 5.8.     Assessment & Plan:   Problem List Items Addressed This Visit       Cardiovascular and Mediastinum   Hypertension   Relevant Medications   rosuvastatin (CRESTOR) 5 MG tablet     Other   Hyperlipidemia - Primary   Relevant Medications   rosuvastatin (CRESTOR) 5 MG tablet   Other Relevant Orders   Lipid Profile   Potassium   Hepatic function panel   Other Visit Diagnoses     Hypokalemia       Relevant Orders   Lipid Profile   Potassium   High risk medication use       Relevant Orders   Hepatic function panel  Meds ordered this encounter  Medications   potassium chloride (KLOR-CON) 10 MEQ tablet    Sig: Take 1 tablet (10 mEq total) by mouth daily.    Dispense:  60 tablet    Refill:  0    Order Specific Question:   Supervising Provider    Answer:   Sallee Lange A [[id number]]   rosuvastatin (CRESTOR) 5 MG tablet    Sig: Take 1 tablet (5 mg total) by mouth daily. For cholesterol    Dispense:  90 tablet    Refill:  0    Order Specific Question:   Supervising Provider    Answer:   Sallee Lange A [[id number]]   Recommend daily low-dose potassium supplement for the next 8 weeks with a recheck of potassium. Recommend daily rosuvastatin low-dose for her cholesterol. Repeat liver and lipid profiles in 8 weeks. Defers colonoscopy and mammogram after in-depth discussion.  Recommend shingles vaccine. Return in about 6 months (around 10/06/2022).

## 2022-04-07 ENCOUNTER — Encounter: Payer: Self-pay | Admitting: Nurse Practitioner

## 2022-04-07 MED ORDER — ROSUVASTATIN CALCIUM 5 MG PO TABS: 5.0000 mg | ORAL_TABLET | Freq: Every day | ORAL | 0 refills | Status: DC

## 2022-04-18 ENCOUNTER — Encounter: Payer: Self-pay | Admitting: Surgery

## 2022-04-18 ENCOUNTER — Ambulatory Visit (INDEPENDENT_AMBULATORY_CARE_PROVIDER_SITE_OTHER): Payer: Medicare Other | Admitting: Surgery

## 2022-04-18 VITALS — BP 146/75 | HR 70 | Temp 97.8°F | Resp 16 | Ht 66.0 in | Wt 190.0 lb

## 2022-04-18 DIAGNOSIS — Z09 Encounter for follow-up examination after completed treatment for conditions other than malignant neoplasm: Secondary | ICD-10-CM

## 2022-04-18 NOTE — Progress Notes (Signed)
[facility] Note   HPI:  71 y.o. Female presents to clinic for post-op follow-up status post laparoscopic cholecystectomy on 5/30.  Since she was discharged from the hospital, she has been doing well.  She is tolerating her diet without nausea and vomiting.  She is moving her bowels without issue.  She denies significant pain associated with her incision sites.  Her only complaint is that there is a stitch sticking out from her lateralmost incision.  Review of Systems:  All other review of systems: otherwise negative   Vital Signs:  There were no vitals taken for this visit.   Physical Exam:  Physical Exam Vitals reviewed.  Constitutional:      Appearance: Normal appearance.  Abdominal:     Comments: Abdomen soft, non distended, no percussion tenderness, non tender to palpation; no rigidity, guarding, or rebound tenderness; Incisions healing well, tails of stitch noted at lateralmost incision-tails cut  Neurological:     Mental Status: She is alert.    Laboratory studies: None   Imaging:  None  Pathology:  A. GALLBLADDER, CHOLECYSTECTOMY:  Acute hemorrhagic cholecystitis superimposed on chronic cholecystitis  and cholelithiasis.  Negative for neoplasm.   Assessment:  71 y.o. yo Female who presents for follow up status post laparoscopic cholecystectomy on 5/30 for acute cholecystitis.  Plan:  -Patient doing well postoperatively with no significant complaints -Follow up as needed  All of the above recommendations were discussed with the patient, and all of patient's questions were answered to her expressed satisfaction.  Graciella Freer, DO [facility] [address] [place], [place] [address] [phone] (office)

## 2022-06-07 ENCOUNTER — Other Ambulatory Visit: Payer: Self-pay | Admitting: Family Medicine

## 2022-07-04 ENCOUNTER — Ambulatory Visit (INDEPENDENT_AMBULATORY_CARE_PROVIDER_SITE_OTHER): Payer: Medicare Other | Admitting: Family Medicine

## 2022-07-04 VITALS — BP 130/77 | HR 70 | Temp 97.5°F | Ht 66.0 in | Wt 195.0 lb

## 2022-07-04 DIAGNOSIS — R109 Unspecified abdominal pain: Secondary | ICD-10-CM

## 2022-07-04 DIAGNOSIS — M7918 Myalgia, other site: Secondary | ICD-10-CM | POA: Diagnosis not present

## 2022-07-04 DIAGNOSIS — E876 Hypokalemia: Secondary | ICD-10-CM

## 2022-07-04 LAB — POCT URINALYSIS DIP (CLINITEK)
Bilirubin, UA: NEGATIVE
Blood, UA: NEGATIVE
Glucose, UA: NEGATIVE mg/dL
Ketones, POC UA: NEGATIVE mg/dL
Leukocytes, UA: NEGATIVE
Nitrite, UA: NEGATIVE
Spec Grav, UA: 1.01 (ref 1.010–1.025)
Urobilinogen, UA: 0.2 E.U./dL
pH, UA: 7.5 (ref 5.0–8.0)

## 2022-07-04 NOTE — Progress Notes (Signed)
   Subjective:    Patient ID: Charlene Thompson, female    DOB: Sep 28, 1951, 70 y.o.   MRN: [id number]  HPI Right lower back pain x 2 week , states feels pain inside the body not on the bone  Had gallbladder removal 02/2022  Side pain lower Can grab with movement Pain with movement  No abd sx Patient with mild discomfort in the right lower back does not radiate into the groin denies abdominal symptoms.  No fever chills sweats no dysuria and no hematuria no hematochezia Review of Systems     Objective:   Physical Exam Lungs clear heart regular low back subjective discomfort in the right lower back she states this causes intense pain with certain movements negative for straight leg raise       Assessment & Plan:   Skill skeletal pain 1. Musculoskeletal pain Check lab work, stretches shown, no hematuria noted, doubt kidney stone - CBC with Differential/Platelet - Hepatic Function Panel - Basic metabolic panel - Magnesium - Sedimentation Rate  2. Right flank pain See per above - CBC with Differential/Platelet - Hepatic Function Panel - Basic metabolic panel - Magnesium - Sedimentation Rate - POCT URINALYSIS DIP (CLINITEK)  3. Hypokalemia Lab work ordered await the results - CBC with Differential/Platelet - Hepatic Function Panel - Basic metabolic panel - Magnesium - Sedimentation Rate I would recommend Tylenol along with stretches.  No need for CAT scan.  Lab work ordered.  If patient not dramatically better within the next 3 weeks to follow-up for further testing

## 2022-07-17 ENCOUNTER — Ambulatory Visit: Payer: Medicare Other | Admitting: Physician Assistant

## 2022-07-26 DIAGNOSIS — D225 Melanocytic nevi of trunk: Secondary | ICD-10-CM | POA: Diagnosis not present

## 2022-07-26 DIAGNOSIS — L820 Inflamed seborrheic keratosis: Secondary | ICD-10-CM | POA: Diagnosis not present

## 2022-09-19 ENCOUNTER — Other Ambulatory Visit: Payer: Self-pay | Admitting: Family Medicine

## 2022-12-12 ENCOUNTER — Other Ambulatory Visit: Payer: Self-pay | Admitting: Family Medicine

## 2022-12-17 IMAGING — CT CT ABD-PELV W/ CM
2 of 5 series · 15 of 46 positions shown, 17 images · IV contrast (Omnipaque or Isovue)
Comparison: None Available.

CLINICAL DATA: Upper abdominal pain, vomiting

EXAM:
CT ABDOMEN AND PELVIS WITH CONTRAST
TECHNIQUE: Multidetector CT imaging of the abdomen and pelvis was performed
using the standard protocol following bolus administration of
intravenous contrast.

[Series 2: axial st · axial · 0.71mm/px · z∈[+1158,+1588]mm · 12 of 102 slices shown, 14 images]
[im 8/102  soft-tissue]
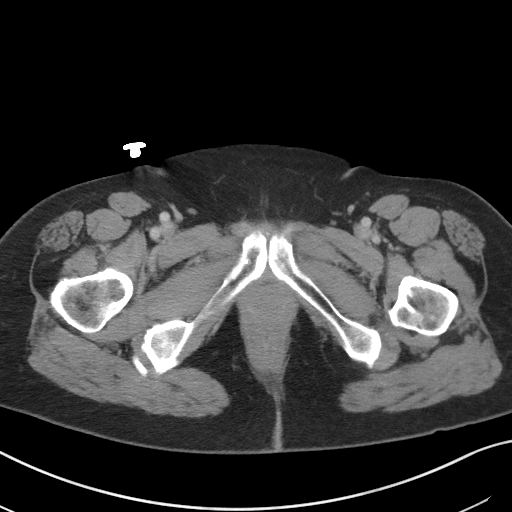
[im 8/102  bone]
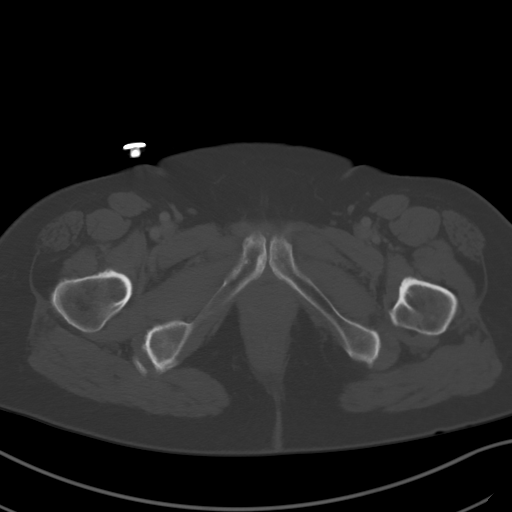
[im 16/102  soft-tissue]
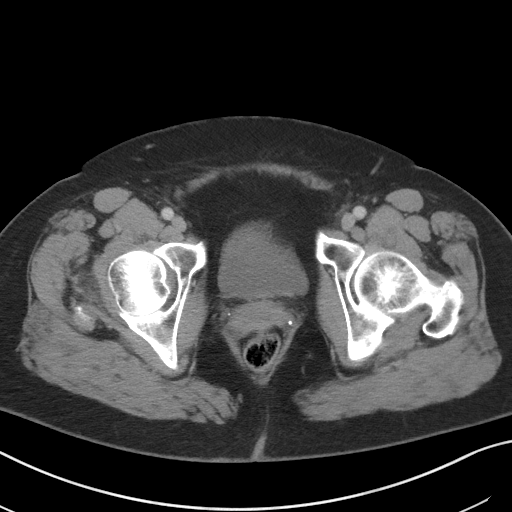
[im 24/102  soft-tissue]
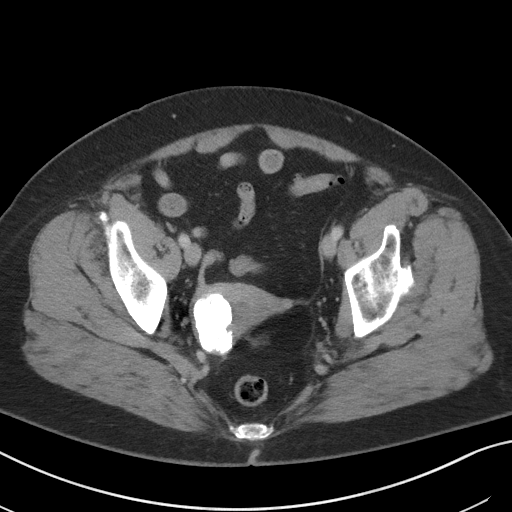
[im 32/102  soft-tissue]
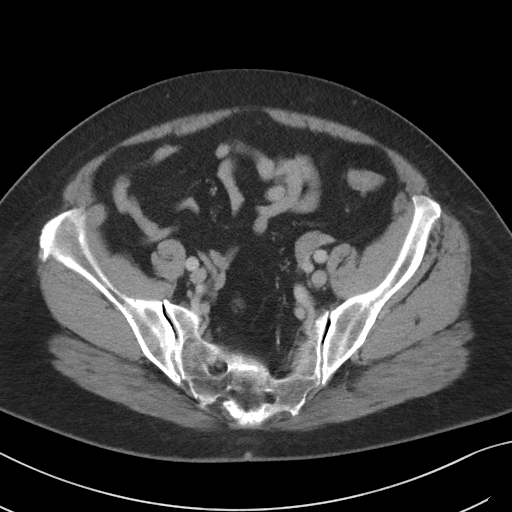
[im 39/102  soft-tissue]
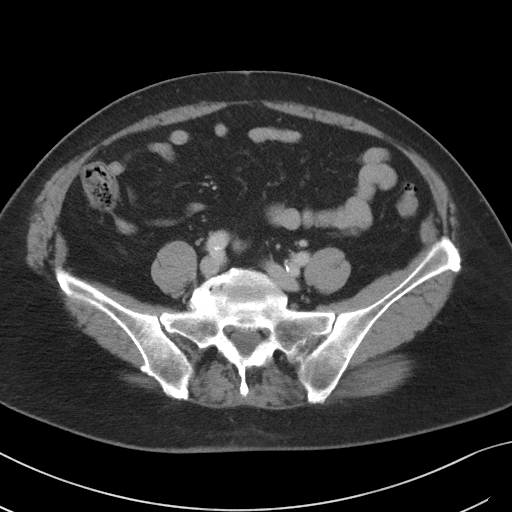
[im 47/102  soft-tissue]
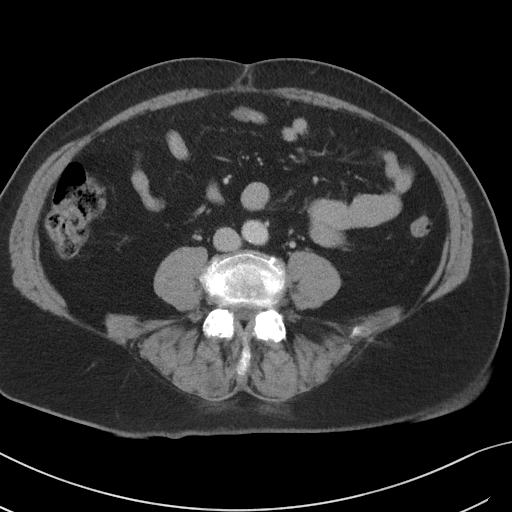
[im 55/102  soft-tissue]
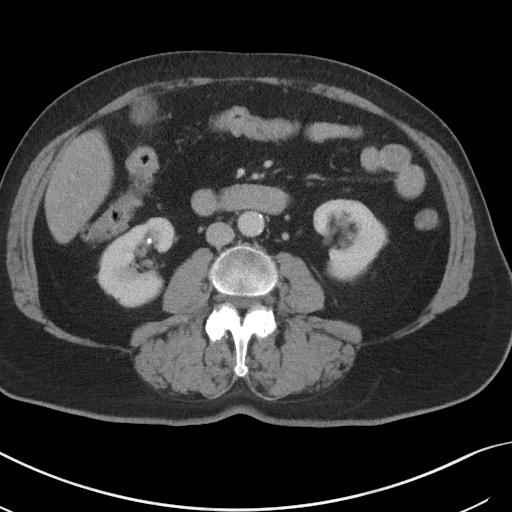
[im 63/102  soft-tissue]
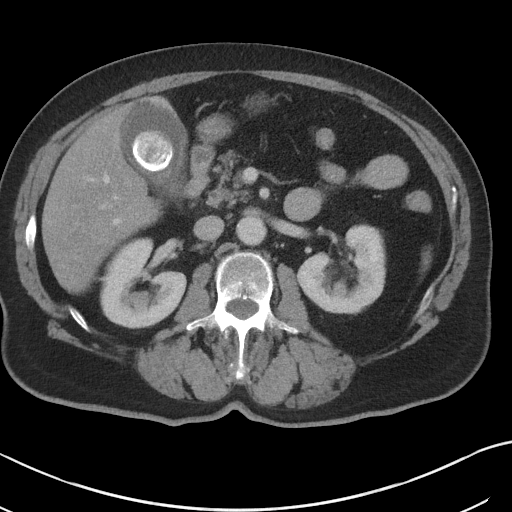
[im 70/102  soft-tissue]
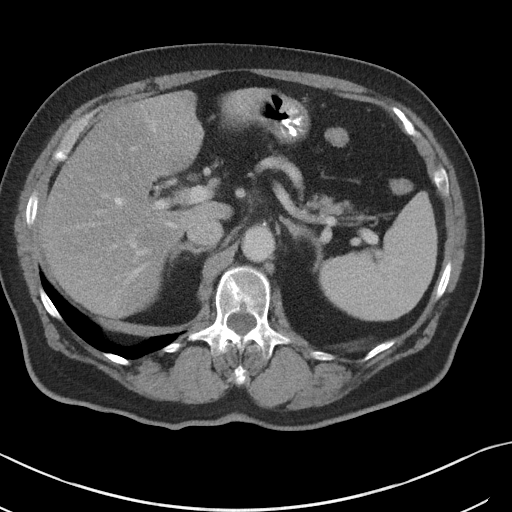
[im 70/102  bone]
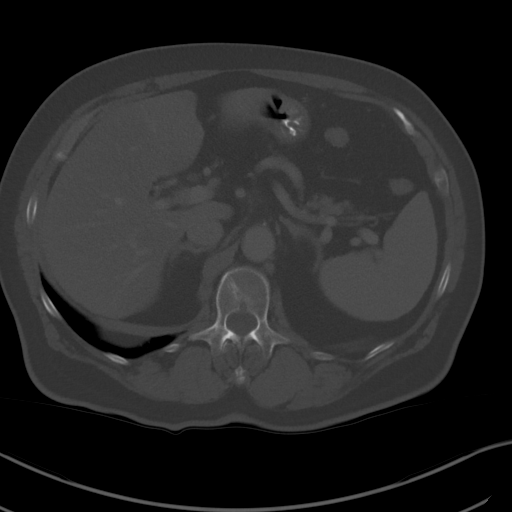
[im 78/102  soft-tissue]
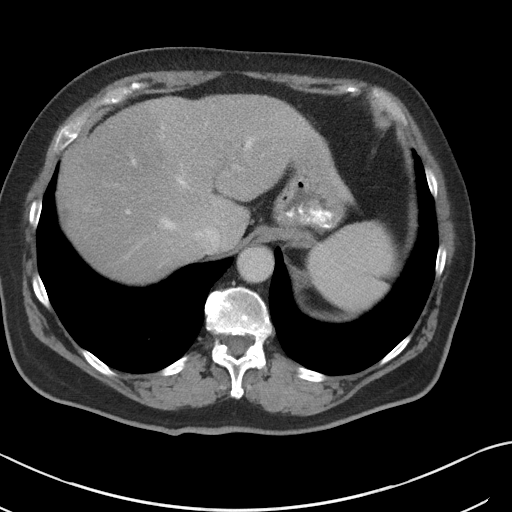
[im 86/102  soft-tissue]
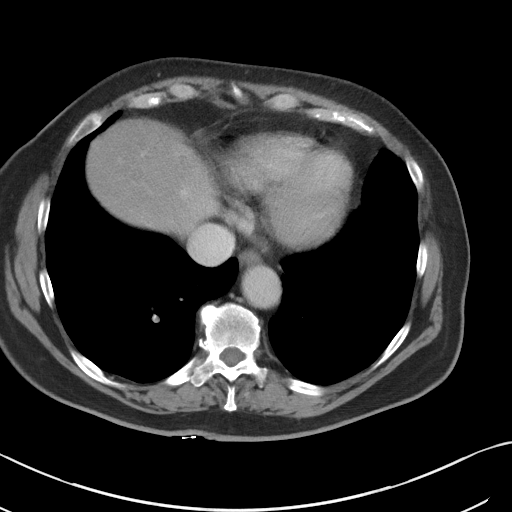
[im 94/102  soft-tissue]
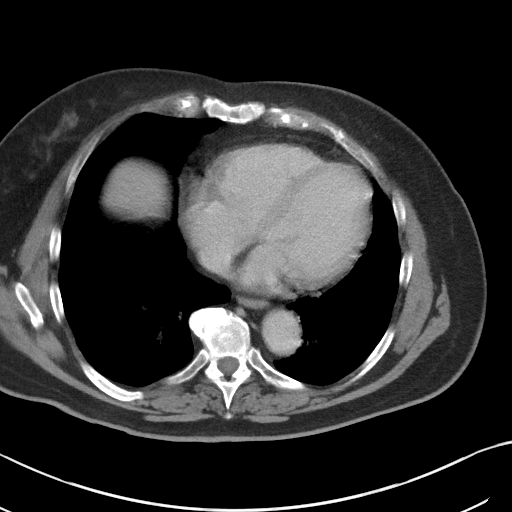

[Series 5: coronal st · coronal · 0.89mm/px · 3 of 92 slices shown]
[im 31/92  soft-tissue]
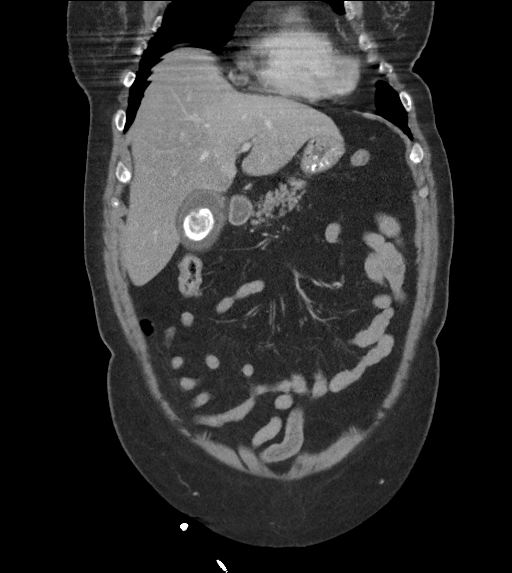
[im 41/92  soft-tissue]
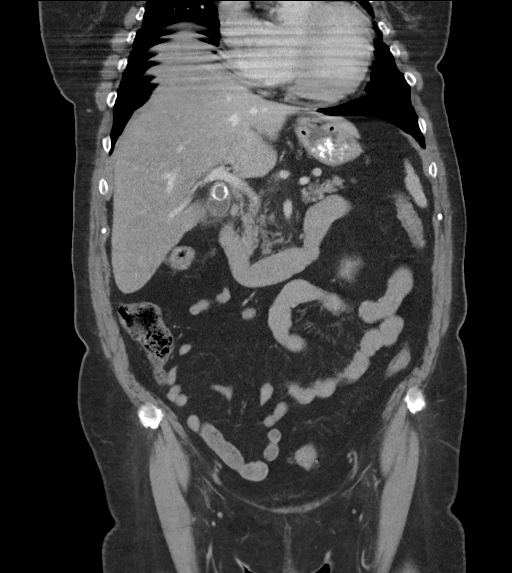
[im 51/92  soft-tissue]
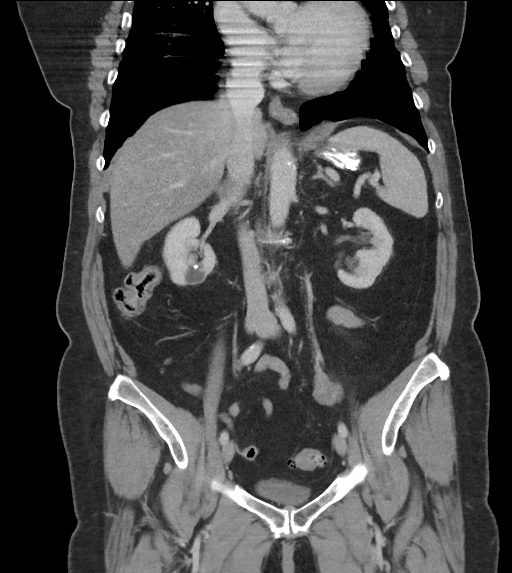

[15 of 46 positions shown; findings below may reference images not displayed]

RADIATION DOSE REDUCTION: This exam was performed according to the
departmental dose-optimization program which includes automated
exposure control, adjustment of the mA and/or kV according to
patient size and/or use of iterative reconstruction technique.

CONTRAST:  100mL OMNIPAQUE IOHEXOL 300 MG/ML  SOLN
FINDINGS: Lower chest: Lung bases are clear.

Hepatobiliary: Liver is notable for geographic hepatic steatosis.

Cholelithiasis with mild gallbladder wall edema and pericholecystic
inflammatory changes (coronal image 36), suspicious for acute
cholecystitis. No intrahepatic or extrahepatic duct dilatation.

Pancreas: Within normal limits.

Spleen: Within normal limits.

Adrenals/Urinary Tract: Adrenal glands are within normal limits.

Left renal sinus cysts. 17 mm right lower pole renal cyst (series
2/image 50) with two adjacent calculi measuring up to 4 mm. No
hydronephrosis.

Bladder is underdistended but unremarkable.

Stomach/Bowel: Stomach is notable for a tiny hiatal hernia.

No evidence of bowel obstruction.

Normal appendix (series 2/image 67).

Mild left colonic diverticulosis, without evidence of
diverticulitis.

Vascular/Lymphatic: No evidence of abdominal aortic aneurysm.

Atherosclerotic calcifications of the abdominal aorta and branch
vessels.

No suspicious abdominopelvic lymphadenopathy.

Reproductive: Calcified uterine fibroids.

Bilateral ovaries are within normal limits.

Other: No abdominopelvic ascites.

Tiny fat containing bilateral inguinal hernias (series 2/image 90).

Musculoskeletal: Mild degenerative changes of the visualized
thoracolumbar spine.
IMPRESSION: Cholecystitis cholelithiasis with suspected acute cholecystitis.

Additional ancillary findings as above.

## 2023-02-11 DIAGNOSIS — L218 Other seborrheic dermatitis: Secondary | ICD-10-CM | POA: Diagnosis not present

## 2023-02-11 DIAGNOSIS — Z1283 Encounter for screening for malignant neoplasm of skin: Secondary | ICD-10-CM | POA: Diagnosis not present

## 2023-02-11 DIAGNOSIS — L570 Actinic keratosis: Secondary | ICD-10-CM | POA: Diagnosis not present

## 2023-03-11 ENCOUNTER — Other Ambulatory Visit: Payer: Self-pay | Admitting: Family Medicine

## 2023-06-06 ENCOUNTER — Other Ambulatory Visit: Payer: Self-pay | Admitting: Family Medicine

## 2023-06-28 ENCOUNTER — Ambulatory Visit (INDEPENDENT_AMBULATORY_CARE_PROVIDER_SITE_OTHER): Payer: Medicare Other

## 2023-06-28 VITALS — Ht 66.0 in | Wt 195.0 lb

## 2023-06-28 DIAGNOSIS — Z0000 Encounter for general adult medical examination without abnormal findings: Secondary | ICD-10-CM | POA: Diagnosis not present

## 2023-06-28 NOTE — Patient Instructions (Signed)
Charlene Thompson , Thank you for taking time to come for your Medicare Wellness Visit. I appreciate your ongoing commitment to your health goals. Please review the following plan we discussed and let me know if I can assist you in the future.   Referrals/Orders/Follow-Ups/Clinician Recommendations:  You declined to have a mammogram, colonoscopy, and bone density scan today. If you change your mind please call the office so that we can set up a referral for you.   This is a list of the screening recommended for you and due dates:  Health Maintenance  Topic Date Due   Zoster (Shingles) Vaccine (1 of 2) Never done   Mammogram  07/06/2016   Colon Cancer Screening  11/23/2019   COVID-19 Vaccine (3 - Pfizer risk series) 01/30/2020   Flu Shot  05/30/2023   Medicare Annual Wellness Visit  06/27/2024   DTaP/Tdap/Td vaccine (3 - Td or Tdap) 06/24/2030   Pneumonia Vaccine  Completed   DEXA scan (bone density measurement)  Completed   Hepatitis C Screening  Completed   HPV Vaccine  Aged Out    Advanced directives: (Declined) Advance directive discussed with you today. Even though you declined this today, please call our office should you change your mind, and we can give you the proper paperwork for you to fill out.  Next Medicare Annual Wellness Visit scheduled for next year: Yes  Preventive Care 65 Years and Older, Female Preventive care refers to lifestyle choices and visits with your health care provider that can promote health and wellness. Preventive care visits are also called wellness exams. What can I expect for my preventive care visit? Counseling Your health care provider may ask you questions about your: Medical history, including: Past medical problems. Family medical history. Pregnancy and menstrual history. History of falls. Current health, including: Memory and ability to understand (cognition). Emotional well-being. Home life and relationship well-being. Sexual activity and  sexual health. Lifestyle, including: Alcohol, nicotine or tobacco, and drug use. Access to firearms. Diet, exercise, and sleep habits. Work and work Astronomer. Sunscreen use. Safety issues such as seatbelt and bike helmet use. Physical exam Your health care provider will check your: Height and weight. These may be used to calculate your BMI (body mass index). BMI is a measurement that tells if you are at a healthy weight. Waist circumference. This measures the distance around your waistline. This measurement also tells if you are at a healthy weight and may help predict your risk of certain diseases, such as type 2 diabetes and high blood pressure. Heart rate and blood pressure. Body temperature. Skin for abnormal spots. What immunizations do I need?  Vaccines are usually given at various ages, according to a schedule. Your health care provider will recommend vaccines for you based on your age, medical history, and lifestyle or other factors, such as travel or where you work. What tests do I need? Screening Your health care provider may recommend screening tests for certain conditions. This may include: Lipid and cholesterol levels. Hepatitis C test. Hepatitis B test. HIV (human immunodeficiency virus) test. STI (sexually transmitted infection) testing, if you are at risk. Lung cancer screening. Colorectal cancer screening. Diabetes screening. This is done by checking your blood sugar (glucose) after you have not eaten for a while (fasting). Mammogram. Talk with your health care provider about how often you should have regular mammograms. BRCA-related cancer screening. This may be done if you have a family history of breast, ovarian, tubal, or peritoneal cancers. Bone density scan.  This is done to screen for osteoporosis. Talk with your health care provider about your test results, treatment options, and if necessary, the need for more tests. Follow these instructions at  home: Eating and drinking  Eat a diet that includes fresh fruits and vegetables, whole grains, lean protein, and low-fat dairy products. Limit your intake of foods with high amounts of sugar, saturated fats, and salt. Take vitamin and mineral supplements as recommended by your health care provider. Do not drink alcohol if your health care provider tells you not to drink. If you drink alcohol: Limit how much you have to 0-1 drink a day. Know how much alcohol is in your drink. In the [place] one drink equals one 12 oz bottle of beer (355 mL), one 5 oz glass of wine (148 mL), or one 1 oz glass of hard liquor (44 mL). Lifestyle Brush your teeth every morning and night with fluoride toothpaste. Floss one time each day. Exercise for at least 30 minutes 5 or more days each week. Do not use any products that contain nicotine or tobacco. These products include cigarettes, chewing tobacco, and vaping devices, such as e-cigarettes. If you need help quitting, ask your health care provider. Do not use drugs. If you are sexually active, practice safe sex. Use a condom or other form of protection in order to prevent STIs. Take aspirin only as told by your health care provider. Make sure that you understand how much to take and what form to take. Work with your health care provider to find out whether it is safe and beneficial for you to take aspirin daily. Ask your health care provider if you need to take a cholesterol-lowering medicine (statin). Find healthy ways to manage stress, such as: Meditation, yoga, or listening to music. Journaling. Talking to a trusted person. Spending time with friends and family. Minimize exposure to UV radiation to reduce your risk of skin cancer. Safety Always wear your seat belt while driving or riding in a vehicle. Do not drive: If you have been drinking alcohol. Do not ride with someone who has been drinking. When you are tired or distracted. While texting. If you have  been using any mind-altering substances or drugs. Wear a helmet and other protective equipment during sports activities. If you have firearms in your house, make sure you follow all gun safety procedures. What's next? Visit your health care provider once a year for an annual wellness visit. Ask your health care provider how often you should have your eyes and teeth checked. Stay up to date on all vaccines. This information is not intended to replace advice given to you by your health care provider. Make sure you discuss any questions you have with your health care provider. Document Revised: 04/12/2021 Document Reviewed: 04/12/2021 Elsevier Patient Education  2024 [employer]. Understanding Your Risk for Falls Millions of people have serious injuries from falls each year. It is important to understand your risk of falling. Talk with your health care provider about your risk and what you can do to lower it. If you do have a serious fall, make sure to tell your provider. Falling once raises your risk of falling again. How can falls affect me? Serious injuries from falls are common. These include: Broken bones, such as hip fractures. Head injuries, such as traumatic brain injuries (TBI) or concussions. A fear of falling can cause you to avoid activities and stay at home. This can make your muscles weaker and raise your risk for  a fall. What can increase my risk? There are a number of risk factors that increase your risk for falling. The more risk factors you have, the higher your risk of falling. Serious injuries from a fall happen most often to people who are older than 74 years old. Teenagers and young adults ages 35-29 are also at higher risk. Common risk factors include: Weakness in the lower body. Being generally weak or confused due to long-term (chronic) illness. Dizziness or balance problems. Poor vision. Medicines that cause dizziness or drowsiness. These may include: Medicines for  your blood pressure, heart, anxiety, insomnia, or swelling (edema). Pain medicines. Muscle relaxants. Other risk factors include: Drinking alcohol. Having had a fall in the past. Having foot pain or wearing improper footwear. Working at a dangerous job. Having any of the following in your home: Tripping hazards, such as floor clutter or loose rugs. Poor lighting. Pets. Having dementia or memory loss. What actions can I take to lower my risk of falling?     Physical activity Stay physically fit. Do strength and balance exercises. Consider taking a regular class to build strength and balance. Yoga and tai chi are good options. Vision Have your eyes checked every year and your prescription for glasses or contacts updated as needed. Shoes and walking aids Wear non-skid shoes. Wear shoes that have rubber soles and low heels. Do not wear high heels. Do not walk around the house in socks or slippers. Use a cane or walker as told by your provider. Home safety Attach secure railings on both sides of your stairs. Install grab bars for your bathtub, shower, and toilet. Use a non-skid mat in your bathtub or shower. Attach bath mats securely with double-sided, non-slip rug tape. Use good lighting in all rooms. Keep a flashlight near your bed. Make sure there is a clear path from your bed to the bathroom. Use night-lights. Do not use throw rugs. Make sure all carpeting is taped or tacked down securely. Remove all clutter from walkways and stairways, including extension cords. Repair uneven or broken steps and floors. Avoid walking on icy or slippery surfaces. Walk on the grass instead of on icy or slick sidewalks. Use ice melter to get rid of ice on walkways in the winter. Use a cordless phone. Questions to ask your health care provider Can you help me check my risk for a fall? Do any of my medicines make me more likely to fall? Should I take a vitamin D supplement? What exercises can I do  to improve my strength and balance? Should I make an appointment to have my vision checked? Do I need a bone density test to check for weak bones (osteoporosis)? Would it help to use a cane or a walker? Where to find more information Centers for Disease Control and Prevention, STEADI: TonerPromos.no Community-Based Fall Prevention Programs: TonerPromos.no [employer] on Aging: BaseRingTones.pl Contact a health care provider if: You fall at home. You are afraid of falling at home. You feel weak, drowsy, or dizzy. This information is not intended to replace advice given to you by your health care provider. Make sure you discuss any questions you have with your health care provider. Document Revised: 06/18/2022 Document Reviewed: 06/18/2022 Elsevier Patient Education  2024 [employer].

## 2023-06-28 NOTE — Progress Notes (Signed)
 Per patient no change in vitals since last visit, unable to obtain new vitals due to telehealth visit Because this visit was a virtual/telehealth visit,  certain criteria was not obtained, such a blood pressure, CBG if patient is a diabetic, and timed get up and go. Any medications not marked as "taking" was not mentioned during the medication reconciliation part of the visit. Any vitals not documented were not able to be obtained due to this being a telehealth visit. Vitals that have been documented are verbally provided by the patient.  Patient was unable to self-report a recent blood pressure reading due to a lack of equipment at home via telehealth.  Subjective:   Charlene Thompson is a 72 y.o. female who presents for Medicare Annual (Subsequent) preventive examination.  Visit Complete: Virtual  I connected with  Charlene Thompson on 06/28/23 by a audio enabled telemedicine application and verified that I am speaking with the correct person using two identifiers.  Patient Location: Home  Provider Location: Home Office  I discussed the limitations of evaluation and management by telemedicine. The patient expressed understanding and agreed to proceed.  Patient Medicare AWV questionnaire was completed by the patient on na; I have confirmed that all information answered by patient is correct and no changes since this date.  Review of Systems     Cardiac Risk Factors include: advanced age (>72men, >15 women);dyslipidemia;hypertension;obesity (BMI >30kg/m2);sedentary lifestyle     Objective:    Today's Vitals   06/28/23 1334  Weight: 195 lb (88.5 kg)  Height: 5\' 6"  (1.676 m)   Body mass index is 31.47 kg/m.     06/28/2023    1:34 PM 03/27/2022   11:27 AM 03/27/2022    3:02 AM  Advanced Directives  Does Patient Have a Medical Advance Directive? No No No  Would patient like information on creating a medical advance directive? No - Patient declined No - Patient declined No - Patient  declined    Current Medications (verified) Outpatient Encounter Medications as of 06/28/2023  Medication Sig   losartan-hydrochlorothiazide (HYZAAR) 100-25 MG tablet TAKE (1) TABLET BY MOUTH ONCE DAILY.   potassium chloride (KLOR-CON) 10 MEQ tablet Take 1 tablet (10 mEq total) by mouth daily.   rosuvastatin (CRESTOR) 5 MG tablet Take 1 tablet (5 mg total) by mouth daily. For cholesterol   No facility-administered encounter medications on file as of 06/28/2023.    Allergies (verified) Valium [diazepam]   History: Past Medical History:  Diagnosis Date   Atypical nevus 09/06/2005   atypical neoplasm- right axilla (EXC)   Atypical nevus 05/16/2018   severe-right upper lip (WS)   Basal cell carcinoma 12/08/2008   with sclerosis-left ear rim (CX35FU)   Basal cell carcinoma of skin 06/13/2012   right side nose ([facility])   Basal cell carcinoma of skin 11/21/2012   chest (txpbx)   Basal cell carcinoma of skin 05/07/2017   nodular-right post shoulder   Hypertension    Squamous cell carcinoma of skin 07/10/2013   left uper arm (CX35FU)   Past Surgical History:  Procedure Laterality Date   BASAL CELL CARCINOMA EXCISION     nose   CARPAL TUNNEL RELEASE Right    CHOLECYSTECTOMY N/A 03/27/2022   Procedure: LAPAROSCOPIC CHOLECYSTECTOMY;  Surgeon: Lewie Chamber, DO;  Location: [facility];  Service: General;  Laterality: N/A;   Family History  Problem Relation Age of Onset   Diabetes Mother    Heart disease Mother    Diabetes Sister  half-sister   Diabetes Brother        half-brother   Heart disease Brother    Kidney disease Sister        half-sister   Social History   Socioeconomic History   Marital status: Married    Spouse name: Not on file   Number of children: 2   Years of education: Not on file   Highest education level: Not on file  Occupational History   Occupation: self employed  Tobacco Use   Smoking status: Never   Smokeless tobacco: Never  Substance  and Sexual Activity   Alcohol use: Yes    Alcohol/week: 0.0 standard drinks of alcohol    Comment: occasional   Drug use: No   Sexual activity: Yes    Partners: Male    Birth control/protection: Post-menopausal  Other Topics Concern   Not on file  Social History Narrative   Not on file   Social Determinants of Health   Financial Resource Strain: Low Risk  (06/28/2023)   Overall Financial Resource Strain (CARDIA)    Difficulty of Paying Living Expenses: Not hard at all  Food Insecurity: No Food Insecurity (06/28/2023)   Hunger Vital Sign    Worried About Running Out of Food in the Last Year: Never true    Ran Out of Food in the Last Year: Never true  Transportation Needs: No Transportation Needs (06/28/2023)   PRAPARE - Administrator, Civil Service (Medical): No    Lack of Transportation (Non-Medical): No  Physical Activity: Sufficiently Active (06/28/2023)   Exercise Vital Sign    Days of Exercise per Week: 7 days    Minutes of Exercise per Session: 30 min  Stress: No Stress Concern Present (06/28/2023)   [employer] of Occupational Health - Occupational Stress Questionnaire    Feeling of Stress : Not at all  Social Connections: Moderately Integrated (06/28/2023)   Social Connection and Isolation Panel [NHANES]    Frequency of Communication with Friends and Family: More than three times a week    Frequency of Social Gatherings with Friends and Family: More than three times a week    Attends Religious Services: More than 4 times per year    Active Member of [employer] or Organizations: No    Attends Engineer, structural: Never    Marital Status: Married    Tobacco Counseling Counseling given: Yes   Clinical Intake:  Pre-visit preparation completed: Yes  Pain : No/denies pain     BMI - recorded: 31.47 Nutritional Status: BMI > 30  Obese Nutritional Risks: None Diabetes: No  How often do you need to have someone help you when you read  instructions, pamphlets, or other written materials from your doctor or pharmacy?: 1 - Never  Interpreter Needed?: No  Information entered by ::  [name], CMA   Activities of Daily Living    06/28/2023    1:40 PM  In your present state of health, do you have any difficulty performing the following activities:  Hearing? 0  Vision? 0  Difficulty concentrating or making decisions? 0  Walking or climbing stairs? 0  Dressing or bathing? 0  Doing errands, shopping? 0  Preparing Food and eating ? N  Using the Toilet? N  In the past six months, have you accidently leaked urine? N  Do you have problems with loss of bowel control? N  Managing your Medications? N  Managing your Finances? N  Housekeeping or managing your Housekeeping? N  Patient Care Team: Babs Sciara, MD as PCP - General (Family Medicine) Glyn Ade, PA-C as Physician Assistant (Dermatology)  Indicate any recent Medical Services you may have received from other than [facility] providers in the past year (date may be approximate).     Assessment:   This is a routine wellness examination for [place].  Hearing/Vision screen Hearing Screening - Comments:: Patient denies any hearing difficulties.   Vision Screening - Comments:: Patient states they wear reading glasses only.    Dietary issues and exercise activities discussed:     Goals Addressed             This Visit's Progress    Patient Stated       To remain active       Depression Screen    06/28/2023    1:37 PM 03/10/2021    1:58 PM 06/24/2020    1:29 PM 09/30/2017    8:16 AM 03/28/2017    3:29 PM 03/06/2015    4:00 PM  PHQ 2/9 Scores  PHQ - 2 Score 0 0 0 0 0 0    Fall Risk    06/28/2023    1:40 PM 04/18/2022    1:06 PM 04/06/2022    2:42 PM 03/10/2021    1:58 PM 01/06/2020   10:52 AM  Fall Risk   Falls in the past year? 0 0 0 0 0  Number falls in past yr: 0  0 0   Injury with Fall? 0  0 0   Risk for fall due to : No Fall Risks  No  Fall Risks    Follow up Falls prevention discussed Falls evaluation completed Falls evaluation completed Falls evaluation completed Falls evaluation completed    MEDICARE RISK AT HOME: Medicare Risk at Home Any stairs in or around the home?: Yes If so, are there any without handrails?: No Home free of loose throw rugs in walkways, pet beds, electrical cords, etc?: Yes Adequate lighting in your home to reduce risk of falls?: Yes Life alert?: No Use of a cane, walker or w/c?: No Grab bars in the bathroom?: Yes Shower chair or bench in shower?: No Elevated toilet seat or a handicapped toilet?: No  TIMED UP AND GO:  Was the test performed?  No    Cognitive Function:        06/28/2023    1:37 PM  6CIT Screen  What Year? 0 points  What month? 0 points  What time? 0 points  Count back from 20 0 points  Months in reverse 0 points  Repeat phrase 0 points  Total Score 0 points    Immunizations Immunization History  Administered Date(s) Administered   PFIZER(Purple Top)SARS-COV-2 Vaccination 12/10/2019, 01/02/2020   Pneumococcal Conjugate-13 09/30/2017   Pneumococcal Polysaccharide-23 03/04/2015   Td 02/20/2000   Tdap 06/24/2020    TDAP status: Up to date  Flu Vaccine status: Due, Education has been provided regarding the importance of this vaccine. Advised may receive this vaccine at local pharmacy or Health Dept. Aware to provide a copy of the vaccination record if obtained from local pharmacy or Health Dept. Verbalized acceptance and understanding.  Pneumococcal vaccine status: Up to date  Covid-19 vaccine status: Information provided on how to obtain vaccines.   Qualifies for Shingles Vaccine? Yes   Zostavax completed No   Shingrix Completed?: No.    Education has been provided regarding the importance of this vaccine. Patient has been advised to call insurance company to determine  out of pocket expense if they have not yet received this vaccine. Advised may also  receive vaccine at local pharmacy or Health Dept. Verbalized acceptance and understanding.  Screening Tests Health Maintenance  Topic Date Due   Medicare Annual Wellness (AWV)  Never done   Zoster Vaccines- Shingrix (1 of 2) Never done   MAMMOGRAM  07/06/2016   Colonoscopy  11/23/2019   COVID-19 Vaccine (3 - Pfizer risk series) 01/30/2020   INFLUENZA VACCINE  05/30/2023   DTaP/Tdap/Td (3 - Td or Tdap) 06/24/2030   Pneumonia Vaccine 23+ Years old  Completed   DEXA SCAN  Completed   Hepatitis C Screening  Completed   HPV VACCINES  Aged Out    Health Maintenance  Health Maintenance Due  Topic Date Due   Medicare Annual Wellness (AWV)  Never done   Zoster Vaccines- Shingrix (1 of 2) Never done   MAMMOGRAM  07/06/2016   Colonoscopy  11/23/2019   COVID-19 Vaccine (3 - Pfizer risk series) 01/30/2020   INFLUENZA VACCINE  05/30/2023    Colorectal Cancer Screening: Patient refused referral to GI for a colonoscopy     Mammogram Status: Patient declines mammogram  Bone Density Status: Patient declined  Lung Cancer Screening: (Low Dose CT Chest recommended if Age 24-80 years, 20 pack-year currently smoking OR have quit w/in 15years.) does not qualify.   Lung Cancer Screening Referral: na  Additional Screening:  Hepatitis C Screening: does not qualify; Completed 08/29/2015  Vision Screening: Recommended annual ophthalmology exams for early detection of glaucoma and other disorders of the eye. Is the patient up to date with their annual eye exam?  Yes  Who is the provider or what is the name of the office in which the patient attends annual eye exams? Dr. Shea Evans in [place] If pt is not established with a provider, would they like to be referred to a provider to establish care? No .   Dental Screening: Recommended annual dental exams for proper oral hygiene  Diabetic Foot Exam: na  Community Resource Referral / Chronic Care Management: CRR required this visit?  No   CCM  required this visit?  No     Plan:     I have personally reviewed and noted the following in the patient's chart:   Medical and social history Use of alcohol, tobacco or illicit drugs  Current medications and supplements including opioid prescriptions. Patient is not currently taking opioid prescriptions. Functional ability and status Nutritional status Physical activity Advanced directives List of other physicians Hospitalizations, surgeries, and ER visits in previous 12 months Vitals Screenings to include cognitive, depression, and falls Referrals and appointments  In addition, I have reviewed and discussed with patient certain preventive protocols, quality metrics, and best practice recommendations. A written personalized care plan for preventive services as well as general preventive health recommendations were provided to patient.     Jordan Hawks Cayle Cordoba, CMA   06/28/2023   After Visit Summary: (Mail) Due to this being a telephonic visit, the after visit summary with patients personalized plan was offered to patient via mail   Nurse Notes: Patient needs a refill on losartan. She wants a 90 day supply please.   She declined mammogram, colonoscopy, and dexa scan.

## 2023-07-08 ENCOUNTER — Other Ambulatory Visit: Payer: Self-pay | Admitting: Family Medicine

## 2023-08-09 ENCOUNTER — Other Ambulatory Visit: Payer: Self-pay | Admitting: Family Medicine

## 2023-11-27 ENCOUNTER — Other Ambulatory Visit: Payer: Self-pay | Admitting: Family Medicine

## 2024-02-05 ENCOUNTER — Other Ambulatory Visit: Payer: Self-pay

## 2024-02-05 ENCOUNTER — Other Ambulatory Visit: Payer: Self-pay | Admitting: Family Medicine

## 2024-02-05 MED ORDER — LOSARTAN POTASSIUM-HCTZ 100-25 MG PO TABS: ORAL_TABLET | ORAL | 3 refills | Status: DC

## 2024-03-31 DIAGNOSIS — L988 Other specified disorders of the skin and subcutaneous tissue: Secondary | ICD-10-CM | POA: Diagnosis not present

## 2024-03-31 DIAGNOSIS — Z1283 Encounter for screening for malignant neoplasm of skin: Secondary | ICD-10-CM | POA: Diagnosis not present

## 2024-03-31 DIAGNOSIS — D2339 Other benign neoplasm of skin of other parts of face: Secondary | ICD-10-CM | POA: Diagnosis not present

## 2024-03-31 DIAGNOSIS — D1801 Hemangioma of skin and subcutaneous tissue: Secondary | ICD-10-CM | POA: Diagnosis not present

## 2024-03-31 DIAGNOSIS — L738 Other specified follicular disorders: Secondary | ICD-10-CM | POA: Diagnosis not present

## 2024-03-31 DIAGNOSIS — L821 Other seborrheic keratosis: Secondary | ICD-10-CM | POA: Diagnosis not present

## 2024-05-28 ENCOUNTER — Telehealth: Payer: Self-pay

## 2024-05-28 NOTE — Telephone Encounter (Signed)
 Prescription Request  05/28/2024  LOV: Visit date not found  What is the name of the medication or equipment? losartan -hydrochlorothiazide  (HYZAAR) 100-25 MG tablet   Have you contacted your pharmacy to request a refill? Yes   Which pharmacy would you like this sent to?   [facility]     Patient notified that their request is being sent to the clinical staff for review and that they should receive a response within 2 business days.   Please advise at Mobile [phone] (mobile)

## 2024-06-08 ENCOUNTER — Telehealth: Payer: Self-pay

## 2024-06-08 ENCOUNTER — Other Ambulatory Visit: Payer: Self-pay

## 2024-06-08 MED ORDER — LOSARTAN POTASSIUM-HCTZ 100-25 MG PO TABS: ORAL_TABLET | ORAL | 0 refills | Status: DC

## 2024-06-08 NOTE — Telephone Encounter (Signed)
 Prescription Request  06/08/2024  LOV: Visit date not found  What is the name of the medication or equipment? losartan -hydrochlorothiazide  (HYZAAR) 100-25 MG tablet   Have you contacted your pharmacy to request a refill? Yes   Which pharmacy would you like this sent to?   [facility]    Patient notified that their request is being sent to the clinical staff for review and that they should receive a response within 2 business days.   Please advise at Mobile [phone] (mobile)

## 2024-07-03 ENCOUNTER — Ambulatory Visit: Payer: Medicare Other

## 2024-07-08 ENCOUNTER — Other Ambulatory Visit: Payer: Self-pay | Admitting: Family Medicine

## 2024-07-08 ENCOUNTER — Other Ambulatory Visit: Payer: Self-pay

## 2024-07-08 MED ORDER — LOSARTAN POTASSIUM-HCTZ 100-25 MG PO TABS: ORAL_TABLET | ORAL | 0 refills | Status: DC

## 2024-07-08 NOTE — Telephone Encounter (Signed)
 Appt has been scheduled   Copied from CRM #[id number]. Topic: Clinical - Medication Refill >> Jul 08, 2024  2:28 PM Precious C wrote: Medication: losartan -hydrochlorothiazide  (HYZAAR) 100-25 MG tablet  Has the patient contacted their pharmacy? Yes, advised to call (Agent: If no, request that the patient contact the pharmacy for the refill. If patient does not wish to contact the pharmacy document the reason why and proceed with request.) (Agent: If yes, when and what did the pharmacy advise?)  This is the patient's preferred pharmacy:  [facility] [place], [place] - [id number] [place] [address] Phone: [phone] Fax: [phone]  Is this the correct pharmacy for this prescription? Yes If no, delete pharmacy and type the correct one.   Has the prescription been filled recently? No  Is the patient out of the medication? Yes  Has the patient been seen for an appointment in the last year OR does the patient have an upcoming appointment? No  Can we respond through MyChart? No  Agent: Please be advised that Rx refills may take up to 3 business days. We ask that you follow-up with your pharmacy.

## 2024-07-10 ENCOUNTER — Ambulatory Visit (INDEPENDENT_AMBULATORY_CARE_PROVIDER_SITE_OTHER): Admitting: Nurse Practitioner

## 2024-07-10 VITALS — BP 155/91 | HR 71 | Ht 66.0 in | Wt 200.0 lb

## 2024-07-10 DIAGNOSIS — R739 Hyperglycemia, unspecified: Secondary | ICD-10-CM

## 2024-07-10 DIAGNOSIS — R7303 Prediabetes: Secondary | ICD-10-CM | POA: Diagnosis not present

## 2024-07-10 DIAGNOSIS — M8580 Other specified disorders of bone density and structure, unspecified site: Secondary | ICD-10-CM

## 2024-07-10 DIAGNOSIS — E785 Hyperlipidemia, unspecified: Secondary | ICD-10-CM | POA: Diagnosis not present

## 2024-07-10 DIAGNOSIS — I10 Essential (primary) hypertension: Secondary | ICD-10-CM | POA: Diagnosis not present

## 2024-07-10 DIAGNOSIS — D509 Iron deficiency anemia, unspecified: Secondary | ICD-10-CM

## 2024-07-10 MED ORDER — LOSARTAN POTASSIUM-HCTZ 100-25 MG PO TABS: ORAL_TABLET | ORAL | 1 refills | Status: AC

## 2024-07-10 NOTE — Progress Notes (Signed)
 "  Subjective:    Patient ID: Charlene Thompson, female    DOB: [birth year], 73 y.o.   MRN: [id number]  HPI Discussed the use of AI scribe software for clinical note transcription with the patient, who gave verbal consent to proceed.  History of Present Illness Charlene Thompson is a 73 year old female who presents for a routine follow-up and medication check.  She has experienced a weight gain of about five pounds over the past year. She has not been monitoring her blood pressure outside the office. She has not had any lab work done since 2023 when a comprehensive workup was performed.  She is currently not taking rosuvastatin  for cholesterol management and instead consumes bananas for potassium. She is on a blood pressure medication, which she took the last dose of today, and she prefers a 90-day supply over a 30-day supply due to convenience.  She has a history of prediabetes. She is active, engaging in activities such as chasing chickens, geese, and turkeys, and doing housework. Her diet is balanced, avoiding excessively sweet, salty, or greasy foods.  She reports a little swelling in her legs, particularly after long periods of sitting. No significant issues with breathing, chest pain, or wheezing. No difficulty with speaking, swallowing, or significant visual changes, although she notes some days her vision seems to improve. She had an eye exam earlier this year.  Her family history includes the loss of her son to brain cancer, which has had a significant emotional impact on her life.    Review of Systems  Respiratory:  Negative for cough, chest tightness, shortness of breath and wheezing.   Cardiovascular:  Positive for leg swelling. Negative for chest pain.  Neurological:  Negative for facial asymmetry, speech difficulty, weakness, numbness and headaches.      07/10/2024   10:30 AM  Depression screen PHQ 2/9  Decreased Interest 0  Down, Depressed, Hopeless 0  PHQ - 2 Score 0  Altered  sleeping 0  Tired, decreased energy 0  Change in appetite 0  Feeling bad or failure about yourself  0  Trouble concentrating 0  Moving slowly or fidgety/restless 0  Suicidal thoughts 0  PHQ-9 Score 0  Difficult doing work/chores Not difficult at all      07/10/2024   10:30 AM  GAD 7 : Generalized Anxiety Score  Nervous, Anxious, on Edge 0  Control/stop worrying 0  Worry too much - different things 0  Trouble relaxing 0  Restless 0  Easily annoyed or irritable 0  Afraid - awful might happen 0  Total GAD 7 Score 0  Anxiety Difficulty Not difficult at all    Social History   Tobacco Use   Smoking status: Never   Smokeless tobacco: Never  Substance Use Topics   Alcohol use: Yes    Alcohol/week: 0.0 standard drinks of alcohol    Comment: occasional   Drug use: No        Objective:   Physical Exam Vitals and nursing note reviewed.  Constitutional:      General: She is not in acute distress. Cardiovascular:     Rate and Rhythm: Normal rate and regular rhythm.  Pulmonary:     Effort: Pulmonary effort is normal.     Breath sounds: Normal breath sounds.  Musculoskeletal:     Comments: Trace edema lower extremities.  Neurological:     Mental Status: She is alert and oriented to person, place, and time.  Psychiatric:  Mood and Affect: Mood normal.        Behavior: Behavior normal.        Thought Content: Thought content normal.        Judgment: Judgment normal.     Today's Vitals   07/10/24 1028 07/10/24 1054  BP: (!) 149/89 (!) 155/91  Pulse: 71   SpO2: 98%   Weight: 200 lb (90.7 kg)   Height: 5' 6 (1.676 m)    Body mass index is 32.28 kg/m.       Assessment & Plan:  1. Primary hypertension (Primary)  - losartan -hydrochlorothiazide  (HYZAAR) 100-25 MG tablet; TAKE ONE TABLET BY MOUTH EVERY DAY  Dispense: 90 tablet; Refill: 1 - Comprehensive metabolic panel with GFR - Lipid panel - Microalbumin / creatinine urine ratio Blood pressure slightly  elevated. Continues antihypertensive medication. No significant symptoms reported. - Recheck blood pressure before leaving. - Prescribe 90-day supply of antihypertensive medication.  2. Osteopenia, unspecified location  - DG Bone Density  3. Hyperglycemia  - Comprehensive metabolic panel with GFR - Hemoglobin A1c  4. Hyperlipidemia, unspecified hyperlipidemia type  - Lipid panel  5. Prediabetes  - Comprehensive metabolic panel with GFR - Hemoglobin A1c - Lipid panel  6. Iron deficiency anemia, unspecified iron deficiency anemia type noted on previous labs  - CBC with Differential/Platelet  Gained 5 pounds over the past year. Discussed importance of routine lab work and bone density study. Engages in physical activity and maintains a balanced diet. Discussed osteoporosis treatment options. - Order routine lab work including kidney and liver function tests. - Order bone density study and provide contact information to schedule. - Encourage continuation of physical activity.  Defers vaccines and colonoscopy. Return in about 6 months (around 01/07/2025).    "

## 2024-07-11 ENCOUNTER — Encounter: Payer: Self-pay | Admitting: Nurse Practitioner

## 2024-07-11 LAB — CBC WITH DIFFERENTIAL/PLATELET
Basophils Absolute: 0 x10E3/uL (ref 0.0–0.2)
Basos: 1 %
EOS (ABSOLUTE): 0.1 x10E3/uL (ref 0.0–0.4)
Eos: 3 %
Hematocrit: 44.5 % (ref 34.0–46.6)
Hemoglobin: 15.1 g/dL (ref 11.1–15.9)
Immature Grans (Abs): 0 x10E3/uL (ref 0.0–0.1)
Immature Granulocytes: 0 %
Lymphocytes Absolute: 1.8 x10E3/uL (ref 0.7–3.1)
Lymphs: 37 %
MCH: 31.2 pg (ref 26.6–33.0)
MCHC: 33.9 g/dL (ref 31.5–35.7)
MCV: 92 fL (ref 79–97)
Monocytes Absolute: 0.5 x10E3/uL (ref 0.1–0.9)
Monocytes: 9 %
Neutrophils Absolute: 2.5 x10E3/uL (ref 1.4–7.0)
Neutrophils: 50 %
Platelets: 189 x10E3/uL (ref 150–450)
RBC: 4.84 x10E6/uL (ref 3.77–5.28)
RDW: 12.5 % (ref 11.7–15.4)
WBC: 4.9 x10E3/uL (ref 3.4–10.8)

## 2024-07-11 LAB — COMPREHENSIVE METABOLIC PANEL WITH GFR
ALT: 40 IU/L — ABNORMAL HIGH (ref 0–32)
AST: 27 IU/L (ref 0–40)
Albumin: 4.5 g/dL (ref 3.8–4.8)
Alkaline Phosphatase: 69 IU/L (ref 44–121)
BUN/Creatinine Ratio: 13 (ref 12–28)
BUN: 12 mg/dL (ref 8–27)
Bilirubin Total: 0.5 mg/dL (ref 0.0–1.2)
CO2: 27 mmol/L (ref 20–29)
Calcium: 10 mg/dL (ref 8.7–10.3)
Chloride: 100 mmol/L (ref 96–106)
Creatinine, Ser: 0.96 mg/dL (ref 0.57–1.00)
Globulin, Total: 2.6 g/dL (ref 1.5–4.5)
Glucose: 96 mg/dL (ref 70–99)
Potassium: 3.9 mmol/L (ref 3.5–5.2)
Sodium: 141 mmol/L (ref 134–144)
Total Protein: 7.1 g/dL (ref 6.0–8.5)
eGFR: 62 mL/min/1.73 (ref >59)

## 2024-07-11 LAB — LIPID PANEL
Chol/HDL Ratio: 3.3 ratio (ref 0.0–4.4)
Cholesterol, Total: 198 mg/dL (ref 100–199)
HDL: 60 mg/dL (ref >39)
LDL Chol Calc (NIH): 121 mg/dL — ABNORMAL HIGH (ref 0–99)
Triglycerides: 96 mg/dL (ref 0–149)
VLDL Cholesterol Cal: 17 mg/dL (ref 5–40)

## 2024-07-11 LAB — HEMOGLOBIN A1C
Est. average glucose Bld gHb Est-mCnc: 128 mg/dL
Hgb A1c MFr Bld: 6.1 % — ABNORMAL HIGH (ref 4.8–5.6)

## 2024-07-11 LAB — MICROALBUMIN / CREATININE URINE RATIO
Creatinine, Urine: 182.9 mg/dL
Microalb/Creat Ratio: 6 mg/g{creat} (ref 0–29)
Microalbumin, Urine: 11.7 ug/mL

## 2024-07-13 ENCOUNTER — Ambulatory Visit: Payer: Self-pay | Admitting: Nurse Practitioner

## 2024-07-13 NOTE — Telephone Encounter (Signed)
-----   Message from Elveria JAYSON Quarry sent at 07/13/2024  8:12 AM EDT ----- Fasting sugar, kidney tests are normal. One of her liver enzymes remains slightly above normal but stable. A1C remains in prediabetic range. Cholesterol improved.  Her 10 year risk of CVD is above 20, so reconsider cholesterol med. No anemia or infection. Urine protein test normal.   ----- Message ----- From: Interface, [facility] Lab Results In Sent: 07/11/2024   7:38 AM EDT To: Elveria JAYSON Quarry, NP

## 2024-07-13 NOTE — Telephone Encounter (Signed)
 Left message for patient to call back regarding her results and recommendations.  Ok for E2c2 to give patient information.

## 2024-09-03 ENCOUNTER — Ambulatory Visit (HOSPITAL_COMMUNITY)
Admission: RE | Admit: 2024-09-03 | Discharge: 2024-09-03 | Disposition: A | Discharge status: Home / Self Care | Source: Ambulatory Visit | Attending: Nurse Practitioner | Admitting: Nurse Practitioner

## 2024-09-03 DIAGNOSIS — M8589 Other specified disorders of bone density and structure, multiple sites: Secondary | ICD-10-CM | POA: Diagnosis not present

## 2024-09-03 DIAGNOSIS — M8580 Other specified disorders of bone density and structure, unspecified site: Secondary | ICD-10-CM | POA: Diagnosis present

## 2024-09-04 ENCOUNTER — Telehealth: Payer: Self-pay | Admitting: *Deleted

## 2024-09-04 NOTE — Telephone Encounter (Signed)
 Copied from CRM #[id number]. Topic: Clinical - Lab/Test Results >> Sep 04, 2024 11:13 AM Larissa RAMAN wrote: Reason for CRM: Relayed results per provider note, patient has additional questions regarding amount of recommended medications.Requesting a callback.

## 2024-09-04 NOTE — Progress Notes (Signed)
 Left voicemail for patient to call back.

## 2024-09-04 NOTE — Telephone Encounter (Signed)
 Patient stated she was calling regarding the bone density- provider recommendations and results given to patient. Patient verbalized understanding.

## 2024-09-04 NOTE — Telephone Encounter (Signed)
 Mauro Elveria BROCKS, NP      09/04/24  1:59 PM Reviewed my notes. For her labs, recommend she consider low dose Rosuvastatin  with repeat labs in 2 months. If she is referring to the bone density, recommend 2000 units of vitamin D per day OTC.

## 2024-09-08 NOTE — Progress Notes (Signed)
 Called patient and left voicemail to give us  a call back about her x-ray results.

## 2024-09-09 NOTE — Progress Notes (Signed)
 Left a message for patient to return call for results as noted.  Bone density shows slightly worsened osteopenia. Recommend vitamin D and calcium . Regular walking program. Repeat test in 2 years. Thanks

## 2024-09-10 NOTE — Progress Notes (Signed)
 See notes.

## 2024-11-05 ENCOUNTER — Ambulatory Visit

## 2025-01-08 ENCOUNTER — Ambulatory Visit: Admitting: Nurse Practitioner

## 2025-01-11 ENCOUNTER — Ambulatory Visit: Admitting: Nurse Practitioner
